# Patient Record
Sex: Female | Born: 1975
Health system: Southern US, Community
[De-identification: ages and names within clinical notes are randomized; demographics above are authoritative.]

## PROBLEM LIST (undated history)

## (undated) DIAGNOSIS — M2140 Flat foot [pes planus] (acquired), unspecified foot: Secondary | ICD-10-CM

## (undated) DIAGNOSIS — K635 Polyp of colon: Secondary | ICD-10-CM

## (undated) DIAGNOSIS — N921 Excessive and frequent menstruation with irregular cycle: Secondary | ICD-10-CM

## (undated) DIAGNOSIS — K589 Irritable bowel syndrome without diarrhea: Secondary | ICD-10-CM

## (undated) DIAGNOSIS — Z9889 Other specified postprocedural states: Secondary | ICD-10-CM

## (undated) DIAGNOSIS — K909 Intestinal malabsorption, unspecified: Secondary | ICD-10-CM

## (undated) DIAGNOSIS — E559 Vitamin D deficiency, unspecified: Secondary | ICD-10-CM

## (undated) DIAGNOSIS — Z8719 Personal history of other diseases of the digestive system: Secondary | ICD-10-CM

## (undated) DIAGNOSIS — R112 Nausea with vomiting, unspecified: Secondary | ICD-10-CM

## (undated) DIAGNOSIS — H8309 Labyrinthitis, unspecified ear: Secondary | ICD-10-CM

## (undated) DIAGNOSIS — R231 Pallor: Secondary | ICD-10-CM

## (undated) DIAGNOSIS — D509 Iron deficiency anemia, unspecified: Secondary | ICD-10-CM

## (undated) DIAGNOSIS — R1013 Epigastric pain: Secondary | ICD-10-CM

## (undated) DIAGNOSIS — R0602 Shortness of breath: Secondary | ICD-10-CM

## (undated) DIAGNOSIS — M1990 Unspecified osteoarthritis, unspecified site: Secondary | ICD-10-CM

## (undated) DIAGNOSIS — G5600 Carpal tunnel syndrome, unspecified upper limb: Secondary | ICD-10-CM

## (undated) DIAGNOSIS — E739 Lactose intolerance, unspecified: Secondary | ICD-10-CM

## (undated) DIAGNOSIS — E049 Nontoxic goiter, unspecified: Secondary | ICD-10-CM

## (undated) DIAGNOSIS — E669 Obesity, unspecified: Secondary | ICD-10-CM

## (undated) DIAGNOSIS — H40059 Ocular hypertension, unspecified eye: Secondary | ICD-10-CM

## (undated) DIAGNOSIS — G43909 Migraine, unspecified, not intractable, without status migrainosus: Secondary | ICD-10-CM

## (undated) DIAGNOSIS — K219 Gastro-esophageal reflux disease without esophagitis: Secondary | ICD-10-CM

## (undated) DIAGNOSIS — M2550 Pain in unspecified joint: Secondary | ICD-10-CM

## (undated) DIAGNOSIS — F419 Anxiety disorder, unspecified: Secondary | ICD-10-CM

## (undated) DIAGNOSIS — M25561 Pain in right knee: Secondary | ICD-10-CM

## (undated) DIAGNOSIS — M25562 Pain in left knee: Secondary | ICD-10-CM

## (undated) HISTORY — DX: Lactose intolerance, unspecified: E73.9

## (undated) HISTORY — DX: Iron deficiency anemia, unspecified: D50.9

## (undated) HISTORY — DX: Migraine, unspecified, not intractable, without status migrainosus: G43.909

## (undated) HISTORY — DX: Pain in left knee: M25.561

## (undated) HISTORY — DX: Other specified postprocedural states: Z98.890

## (undated) HISTORY — DX: Epigastric pain: R10.13

## (undated) HISTORY — DX: Pain in right knee: M25.562

## (undated) HISTORY — DX: Nontoxic goiter, unspecified: E04.9

## (undated) HISTORY — DX: Labyrinthitis, unspecified ear: H83.09

## (undated) HISTORY — DX: Gastro-esophageal reflux disease without esophagitis: K21.9

## (undated) HISTORY — DX: Shortness of breath: R06.02

## (undated) HISTORY — DX: Polyp of colon: K63.5

## (undated) HISTORY — DX: Personal history of other diseases of the digestive system: Z87.19

## (undated) HISTORY — DX: Pain in unspecified joint: M25.50

## (undated) HISTORY — DX: Irritable bowel syndrome, unspecified: K58.9

## (undated) HISTORY — PX: LAPAROSCOPY: SHX197

## (undated) HISTORY — DX: Flat foot (pes planus) (acquired), unspecified foot: M21.40

## (undated) HISTORY — DX: Vitamin D deficiency, unspecified: E55.9

## (undated) HISTORY — DX: Pallor: R23.1

## (undated) HISTORY — PX: UPPER GI ENDOSCOPY: SHX6162

## (undated) HISTORY — DX: Intestinal malabsorption, unspecified: K90.9

## (undated) HISTORY — DX: Obesity, unspecified: E66.9

## (undated) HISTORY — PX: COLONOSCOPY: SHX174

## (undated) HISTORY — DX: Excessive and frequent menstruation with irregular cycle: N92.1

## (undated) HISTORY — DX: Carpal tunnel syndrome, unspecified upper limb: G56.00

---

## 2003-12-11 ENCOUNTER — Emergency Department (HOSPITAL_COMMUNITY): Admission: EM | Admit: 2003-12-11 | Discharge: 2003-12-11 | Payer: Self-pay | Admitting: Emergency Medicine

## 2004-01-27 ENCOUNTER — Emergency Department (HOSPITAL_COMMUNITY): Admission: EM | Admit: 2004-01-27 | Discharge: 2004-01-27 | Payer: Self-pay | Admitting: Emergency Medicine

## 2009-08-12 ENCOUNTER — Emergency Department (HOSPITAL_COMMUNITY): Admission: EM | Admit: 2009-08-12 | Discharge: 2009-08-13 | Payer: Self-pay | Admitting: Emergency Medicine

## 2009-09-11 ENCOUNTER — Other Ambulatory Visit: Admission: RE | Admit: 2009-09-11 | Discharge: 2009-09-11 | Payer: Self-pay | Admitting: Obstetrics and Gynecology

## 2010-03-21 ENCOUNTER — Ambulatory Visit (HOSPITAL_COMMUNITY): Admission: RE | Admit: 2010-03-21 | Discharge: 2010-03-21 | Payer: Self-pay | Admitting: Obstetrics and Gynecology

## 2010-03-27 ENCOUNTER — Ambulatory Visit (HOSPITAL_COMMUNITY): Admission: RE | Admit: 2010-03-27 | Discharge: 2010-03-27 | Payer: Self-pay | Admitting: Obstetrics and Gynecology

## 2010-03-28 ENCOUNTER — Inpatient Hospital Stay (HOSPITAL_COMMUNITY): Admission: AD | Admit: 2010-03-28 | Discharge: 2010-03-31 | Payer: Self-pay | Admitting: Obstetrics and Gynecology

## 2010-03-29 ENCOUNTER — Encounter (INDEPENDENT_AMBULATORY_CARE_PROVIDER_SITE_OTHER): Payer: Self-pay | Admitting: Obstetrics and Gynecology

## 2010-12-28 ENCOUNTER — Encounter: Payer: Self-pay | Admitting: Internal Medicine

## 2011-02-24 LAB — CBC
HCT: 23.9 % — ABNORMAL LOW (ref 36.0–46.0)
HCT: 25 % — ABNORMAL LOW (ref 36.0–46.0)
HCT: 30.7 % — ABNORMAL LOW (ref 36.0–46.0)
Hemoglobin: 7.6 g/dL — ABNORMAL LOW (ref 12.0–15.0)
Hemoglobin: 7.9 g/dL — ABNORMAL LOW (ref 12.0–15.0)
Hemoglobin: 9.6 g/dL — ABNORMAL LOW (ref 12.0–15.0)
MCHC: 31.1 g/dL (ref 30.0–36.0)
MCHC: 31.4 g/dL (ref 30.0–36.0)
MCHC: 31.8 g/dL (ref 30.0–36.0)
MCV: 67.4 fL — ABNORMAL LOW (ref 78.0–100.0)
MCV: 67.5 fL — ABNORMAL LOW (ref 78.0–100.0)
MCV: 68 fL — ABNORMAL LOW (ref 78.0–100.0)
Platelets: 130 10*3/uL — ABNORMAL LOW (ref 150–400)
Platelets: 147 10*3/uL — ABNORMAL LOW (ref 150–400)
Platelets: 164 10*3/uL (ref 150–400)
RBC: 3.55 MIL/uL — ABNORMAL LOW (ref 3.87–5.11)
RBC: 3.68 MIL/uL — ABNORMAL LOW (ref 3.87–5.11)
RBC: 4.55 MIL/uL (ref 3.87–5.11)
RDW: 21.9 % — ABNORMAL HIGH (ref 11.5–15.5)
RDW: 22.1 % — ABNORMAL HIGH (ref 11.5–15.5)
RDW: 22.1 % — ABNORMAL HIGH (ref 11.5–15.5)
WBC: 14.8 10*3/uL — ABNORMAL HIGH (ref 4.0–10.5)
WBC: 15.5 10*3/uL — ABNORMAL HIGH (ref 4.0–10.5)
WBC: 18.7 10*3/uL — ABNORMAL HIGH (ref 4.0–10.5)

## 2011-02-24 LAB — TYPE AND SCREEN
ABO/RH(D): B POS
Antibody Screen: NEGATIVE

## 2011-02-24 LAB — RPR: RPR Ser Ql: NONREACTIVE

## 2011-02-24 LAB — ABO/RH: ABO/RH(D): B POS

## 2011-03-13 LAB — CBC
HCT: 35.7 % — ABNORMAL LOW (ref 36.0–46.0)
Hemoglobin: 11.7 g/dL — ABNORMAL LOW (ref 12.0–15.0)
MCHC: 32.9 g/dL (ref 30.0–36.0)
MCV: 87.3 fL (ref 78.0–100.0)
Platelets: 232 10*3/uL (ref 150–400)
RBC: 4.09 MIL/uL (ref 3.87–5.11)
RDW: 14.6 % (ref 11.5–15.5)
WBC: 8.7 10*3/uL (ref 4.0–10.5)

## 2011-03-13 LAB — DIFFERENTIAL
Basophils Absolute: 0 10*3/uL (ref 0.0–0.1)
Basophils Relative: 0 % (ref 0–1)
Eosinophils Absolute: 0.1 10*3/uL (ref 0.0–0.7)
Eosinophils Relative: 1 % (ref 0–5)
Lymphocytes Relative: 15 % (ref 12–46)
Lymphs Abs: 1.3 10*3/uL (ref 0.7–4.0)
Monocytes Absolute: 0.6 10*3/uL (ref 0.1–1.0)
Monocytes Relative: 7 % (ref 3–12)
Neutro Abs: 6.6 10*3/uL (ref 1.7–7.7)
Neutrophils Relative %: 76 % (ref 43–77)

## 2011-03-13 LAB — POCT I-STAT, CHEM 8
BUN: 13 mg/dL (ref 6–23)
Calcium, Ion: 1.13 mmol/L (ref 1.12–1.32)
Chloride: 105 mEq/L (ref 96–112)
Creatinine, Ser: 1 mg/dL (ref 0.4–1.2)
Glucose, Bld: 93 mg/dL (ref 70–99)
HCT: 37 % (ref 36.0–46.0)
Hemoglobin: 12.6 g/dL (ref 12.0–15.0)
Potassium: 3.9 mEq/L (ref 3.5–5.1)
Sodium: 138 mEq/L (ref 135–145)
TCO2: 21 mmol/L (ref 0–100)

## 2011-03-13 LAB — HCG, QUANTITATIVE, PREGNANCY: hCG, Beta Chain, Quant, S: 73136 m[IU]/mL — ABNORMAL HIGH (ref <5)

## 2011-03-13 LAB — URINALYSIS, ROUTINE W REFLEX MICROSCOPIC
Bilirubin Urine: NEGATIVE
Glucose, UA: NEGATIVE mg/dL
Hgb urine dipstick: NEGATIVE
Ketones, ur: NEGATIVE mg/dL
Nitrite: NEGATIVE
Protein, ur: NEGATIVE mg/dL
Specific Gravity, Urine: 1.024 (ref 1.005–1.030)
Urobilinogen, UA: 1 mg/dL (ref 0.0–1.0)
pH: 6 (ref 5.0–8.0)

## 2011-03-13 LAB — POCT PREGNANCY, URINE: Preg Test, Ur: POSITIVE

## 2011-04-10 ENCOUNTER — Other Ambulatory Visit: Payer: Self-pay | Admitting: Obstetrics and Gynecology

## 2011-04-10 ENCOUNTER — Other Ambulatory Visit (HOSPITAL_COMMUNITY)
Admission: RE | Admit: 2011-04-10 | Discharge: 2011-04-10 | Disposition: A | Discharge status: Home / Self Care | Payer: Federal, State, Local not specified - PPO | Source: Ambulatory Visit | Attending: Obstetrics and Gynecology | Admitting: Obstetrics and Gynecology

## 2011-04-10 DIAGNOSIS — Z01419 Encounter for gynecological examination (general) (routine) without abnormal findings: Secondary | ICD-10-CM | POA: Insufficient documentation

## 2012-04-29 ENCOUNTER — Other Ambulatory Visit (HOSPITAL_COMMUNITY)
Admission: RE | Admit: 2012-04-29 | Discharge: 2012-04-29 | Disposition: A | Discharge status: Home / Self Care | Payer: Federal, State, Local not specified - PPO | Source: Ambulatory Visit | Attending: Obstetrics and Gynecology | Admitting: Obstetrics and Gynecology

## 2012-04-29 ENCOUNTER — Other Ambulatory Visit: Payer: Self-pay | Admitting: Obstetrics and Gynecology

## 2012-04-29 DIAGNOSIS — Z01419 Encounter for gynecological examination (general) (routine) without abnormal findings: Secondary | ICD-10-CM | POA: Insufficient documentation

## 2013-10-03 ENCOUNTER — Other Ambulatory Visit: Payer: Self-pay | Admitting: Obstetrics and Gynecology

## 2013-10-03 ENCOUNTER — Other Ambulatory Visit (HOSPITAL_COMMUNITY)
Admission: RE | Admit: 2013-10-03 | Discharge: 2013-10-03 | Disposition: A | Discharge status: Home / Self Care | Payer: Federal, State, Local not specified - PPO | Source: Ambulatory Visit | Attending: Obstetrics and Gynecology | Admitting: Obstetrics and Gynecology

## 2013-10-03 DIAGNOSIS — Z1151 Encounter for screening for human papillomavirus (HPV): Secondary | ICD-10-CM | POA: Insufficient documentation

## 2013-10-03 DIAGNOSIS — Z01419 Encounter for gynecological examination (general) (routine) without abnormal findings: Secondary | ICD-10-CM | POA: Insufficient documentation

## 2013-10-23 ENCOUNTER — Other Ambulatory Visit: Payer: Self-pay

## 2013-10-23 DIAGNOSIS — Z1231 Encounter for screening mammogram for malignant neoplasm of breast: Secondary | ICD-10-CM

## 2013-11-24 ENCOUNTER — Ambulatory Visit: Payer: Federal, State, Local not specified - PPO

## 2014-08-03 LAB — PULMONARY FUNCTION TEST

## 2014-10-10 ENCOUNTER — Other Ambulatory Visit: Payer: Self-pay | Admitting: Gastroenterology

## 2014-10-10 DIAGNOSIS — R1084 Generalized abdominal pain: Secondary | ICD-10-CM

## 2014-10-17 ENCOUNTER — Ambulatory Visit
Admission: RE | Admit: 2014-10-17 | Discharge: 2014-10-17 | Disposition: A | Discharge status: Home / Self Care | Payer: Federal, State, Local not specified - PPO | Source: Ambulatory Visit | Attending: Gastroenterology | Admitting: Gastroenterology

## 2014-10-17 DIAGNOSIS — R1084 Generalized abdominal pain: Secondary | ICD-10-CM

## 2014-10-17 MED ORDER — IOHEXOL 300 MG/ML  SOLN
100.0000 mL | Freq: Once | INTRAMUSCULAR | Status: AC | PRN
  Administered 2014-10-17: 100 mL via INTRAVENOUS

## 2015-01-22 ENCOUNTER — Other Ambulatory Visit: Payer: Self-pay | Admitting: Obstetrics and Gynecology

## 2015-01-22 ENCOUNTER — Other Ambulatory Visit (HOSPITAL_COMMUNITY)
Admission: RE | Admit: 2015-01-22 | Discharge: 2015-01-22 | Disposition: A | Discharge status: Home / Self Care | Payer: Federal, State, Local not specified - PPO | Source: Ambulatory Visit | Attending: Obstetrics and Gynecology | Admitting: Obstetrics and Gynecology

## 2015-01-22 DIAGNOSIS — Z01419 Encounter for gynecological examination (general) (routine) without abnormal findings: Secondary | ICD-10-CM | POA: Insufficient documentation

## 2015-01-23 LAB — CYTOLOGY - PAP

## 2015-02-19 DIAGNOSIS — K580 Irritable bowel syndrome with diarrhea: Secondary | ICD-10-CM | POA: Insufficient documentation

## 2015-02-19 DIAGNOSIS — B009 Herpesviral infection, unspecified: Secondary | ICD-10-CM | POA: Insufficient documentation

## 2015-02-19 DIAGNOSIS — K219 Gastro-esophageal reflux disease without esophagitis: Secondary | ICD-10-CM | POA: Insufficient documentation

## 2015-02-21 DIAGNOSIS — E042 Nontoxic multinodular goiter: Secondary | ICD-10-CM | POA: Insufficient documentation

## 2015-03-13 DIAGNOSIS — R231 Pallor: Secondary | ICD-10-CM | POA: Insufficient documentation

## 2015-03-19 DIAGNOSIS — R195 Other fecal abnormalities: Secondary | ICD-10-CM | POA: Insufficient documentation

## 2015-04-08 ENCOUNTER — Encounter: Payer: Self-pay | Admitting: Interventional Cardiology

## 2015-04-08 ENCOUNTER — Ambulatory Visit (INDEPENDENT_AMBULATORY_CARE_PROVIDER_SITE_OTHER): Payer: Federal, State, Local not specified - PPO | Admitting: Interventional Cardiology

## 2015-04-08 VITALS — BP 104/68 | HR 65 | Ht 63.0 in | Wt 192.1 lb

## 2015-04-08 DIAGNOSIS — I471 Supraventricular tachycardia: Secondary | ICD-10-CM

## 2015-04-08 DIAGNOSIS — I209 Angina pectoris, unspecified: Secondary | ICD-10-CM | POA: Diagnosis not present

## 2015-04-08 DIAGNOSIS — R079 Chest pain, unspecified: Secondary | ICD-10-CM | POA: Insufficient documentation

## 2015-04-08 DIAGNOSIS — E042 Nontoxic multinodular goiter: Secondary | ICD-10-CM

## 2015-04-08 DIAGNOSIS — D509 Iron deficiency anemia, unspecified: Secondary | ICD-10-CM | POA: Insufficient documentation

## 2015-04-08 DIAGNOSIS — K2100 Gastro-esophageal reflux disease with esophagitis, without bleeding: Secondary | ICD-10-CM

## 2015-04-08 DIAGNOSIS — K219 Gastro-esophageal reflux disease without esophagitis: Secondary | ICD-10-CM | POA: Insufficient documentation

## 2015-04-08 DIAGNOSIS — E049 Nontoxic goiter, unspecified: Secondary | ICD-10-CM | POA: Insufficient documentation

## 2015-04-08 NOTE — Patient Instructions (Signed)
Medication Instructions:  Your physician recommends that you continue on your current medications as directed. Please refer to the Current Medication list given to you today.   Labwork: None   Testing/Procedures: Your physician has requested that you have an echocardiogram. Echocardiography is a painless test that uses sound waves to create images of your heart. It provides your doctor with information about the size and shape of your heart and how well your heart's chambers and valves are working. This procedure takes approximately one hour. There are no restrictions for this procedure.  Your physician has recommended that you wear an event monitor. Event monitors are medical devices that record the heart's electrical activity. Doctors most often [place] these monitors to diagnose arrhythmias. Arrhythmias are problems with the speed or rhythm of the heartbeat. The monitor is a small, portable device. You can wear one while you do your normal daily activities. This is usually used to diagnose what is causing palpitations/syncope (passing out).    Follow-Up: Your physician recommends that you schedule a follow-up appointment pending results   Any Other Special Instructions Will Be Listed Below (If Applicable).

## 2015-04-08 NOTE — Progress Notes (Signed)
Cardiology Office Note   Date:  04/08/2015   ID:  Nicole Booth, DOB 1976/10/22, MRN [id number]  PCP:  Lilian Coma., MD  Cardiologist:   Sinclair Grooms, MD   Chief Complaint  Patient presents with  . Chest Pain      History of Present Illness: Nicole Booth is a 39 y.o. female who presents for evaluation of recurring palpitations, chest pressure, with discomfort radiating into her jaw bilaterally. She has a history of anemia. The anemia is iron deficiency. She was initially diagnosed one year ago. She was treated and now it has recurred. Hemoglobin is been as low as 8.7. She has no exertional discomfort. She denies palpitations and syncope. There is no orthopnea. She has not had lower extremity edema.  She denies back pain, prior stroke, smoking, diabetes, hypertension, prior stroke, heart murmurs child, and family history of sudden death.    Past Medical History  Diagnosis Date  . Obesity   . Livedo reticularis   . Goiter   . GERD (gastroesophageal reflux disease)   . Epigastric pain   . Iron deficiency anemia   . Glaucoma   . Vitamin D deficiency   . Migraine headache   . Labyrinthitis   . IBS (irritable bowel syndrome)   . Carpal tunnel syndrome   . Bilateral knee pain   . Joint pain   . Pes planus   . SOB (shortness of breath)   . Mesothelioma   . Hyperplastic colon polyp   . Lactose intolerance   . H/O umbilical hernia repair     Past Surgical History  Procedure Laterality Date  . Laparoscopy    . Cesarean section       Current Outpatient Prescriptions  Medication Sig Dispense Refill  . chlorpheniramine-HYDROcodone (TUSSIONEX) 10-8 MG/5ML LQCR 5 mLs at bedtime as needed.   0  . hyoscyamine (ANASPAZ) 0.125 MG TBDP disintergrating tablet Place 0.125 mg under the tongue every 4 (four) hours as needed.   6  . hyoscyamine (LEVBID) 0.375 MG 12 hr tablet Take 0.375 mg by mouth at bedtime.  3  . NU-IRON 150 MG capsule Take 150 mg by mouth  daily.   11  . pantoprazole (PROTONIX) 40 MG tablet Take 40 mg by mouth daily.  12  . QSYMIA 7.5-46 MG CP24      No current facility-administered medications for this visit.    Allergies:   Penicillins; Dairy aid; and Shellfish-derived products    Social History:  The patient  reports that she has never smoked. She does not have any smokeless tobacco history on file.   Family History:  The patient's family history includes Breast cancer in her paternal aunt; Cancer in her maternal aunt; Diabetes in her father; Fibromyalgia in her mother; Hypertension in her father.    ROS:  Please see the history of present illness.   Otherwise, review of systems are positive for difficulty sleeping, snoring, fatigue, and weight gain. She has a history of a goiter.   All other systems are reviewed and negative.    PHYSICAL EXAM: VS:  BP 104/68 mmHg  Pulse 65  Ht 5\' 3"  (1.6 m)  Wt 192 lb 1.9 oz (87.145 kg)  BMI 34.04 kg/m2  LMP 04/02/2015 , BMI Body mass index is 34.04 kg/(m^2). GEN: Well nourished, well developed, no acute distress HEENT: normal Neck: no JVD, carotid bruits, or masses Cardiac: RRR; no murmurs, rubs, or gallops,no edema  Respiratory:  clear to auscultation bilaterally, normal work  of breathing GI: soft, nontender, nondistended, + BS MS: no deformity or atrophy Skin: warm and dry, no rash Neuro:  Strength and sensation are intact Psych: euthymic mood, full affect   EKG:  EKG is ordered today. The ekg ordered today demonstrates normal sinus rhythm with nonspecific T-wave flattening.   Recent Labs: No results found for requested labs within last 365 days.    Lipid Panel No results found for: CHOL, TRIG, HDL, CHOLHDL, VLDL, LDLCALC, LDLDIRECT    Wt Readings from Last 3 Encounters:  04/08/15 192 lb 1.9 oz (87.145 kg)      Other studies Reviewed: Additional studies/ records that were reviewed today include: [facility], [facility], and Dr. Earlean Shawl.. Review of the above records  demonstrates: These records indicated the patient is had a history of iron deficiency anemia that has been recurrent since 2008. Most recently the hemoglobin is 8.9. Prior episodes of anemia had been taking hemoglobin levels around 10.5. She has a history of multinodular goiter, livedo reticularis, esophageal reflux, and glaucoma   ASSESSMENT AND PLAN:  Chest tightness: This is likely due to ischemia produced in the setting of severe anemia by a superimposed supraventricular arrhythmia  Recurrent tachycardia/palpitations: Rule out PSVT of the patient's age   Iron deficiency anemia: Etiologies being defined by Dr. Earlean Shawl  Multinodular goiter: No history of hyper or hypothyroidism  Esophageal reflux: Could potentially be the source of the patient's discomfort but would not explain the palpitations.  Current medicines are reviewed at length with the patient today.  The patient does not have concerns regarding medicines.  The following changes have been made:  The patient will undergo a 2-D Doppler echocardiogram to assess LV size, function, and to rule out pulmonary hypertension. She will also begin a 30 day continuous monitoring to identify the source of the tachycardia.  Labs/ tests ordered today include:   Orders Placed This Encounter  Procedures  . Cardiac event monitor  . EKG 12-Lead  . Echocardiogram     Disposition:   FU with HS in 5 weeks  Signed, Sinclair Grooms, MD  04/08/2015 5:34 PM    [address], [place], [place]  [address] Phone: [phone]; Fax: [phone]

## 2015-04-09 ENCOUNTER — Encounter: Payer: Self-pay | Admitting: Radiology

## 2015-04-09 ENCOUNTER — Ambulatory Visit (INDEPENDENT_AMBULATORY_CARE_PROVIDER_SITE_OTHER): Payer: Federal, State, Local not specified - PPO

## 2015-04-09 DIAGNOSIS — I471 Supraventricular tachycardia: Secondary | ICD-10-CM | POA: Diagnosis not present

## 2015-04-09 NOTE — Progress Notes (Signed)
Patient ID: Draya Felker, female   DOB: 06-13-1976, 39 y.o.   MRN: [id number] lifewatch 30 day monitor applied. EOS 05-10-15

## 2015-04-15 ENCOUNTER — Ambulatory Visit (HOSPITAL_COMMUNITY): Payer: Federal, State, Local not specified - PPO | Attending: Cardiovascular Disease

## 2015-04-15 DIAGNOSIS — I209 Angina pectoris, unspecified: Secondary | ICD-10-CM | POA: Diagnosis not present

## 2015-04-15 DIAGNOSIS — I340 Nonrheumatic mitral (valve) insufficiency: Secondary | ICD-10-CM | POA: Insufficient documentation

## 2015-04-16 ENCOUNTER — Telehealth: Payer: Self-pay | Admitting: Interventional Cardiology

## 2015-04-16 NOTE — Telephone Encounter (Signed)
Pt aware of echo results. The echocardiogram demonstrates a structurally normal heart. Pt is currenltly wearing an event monitor. Adv her we call with the results when that is completed. Pt verbalized understanding.

## 2015-04-16 NOTE — Telephone Encounter (Signed)
New message ° ° ° ° ° °Want echo results °

## 2015-04-16 NOTE — Telephone Encounter (Signed)
-----   Message from Belva Crome, MD sent at 04/15/2015  7:38 PM EDT ----- The echocardiogram demonstrates a structurally normal heart

## 2015-04-16 NOTE — Telephone Encounter (Signed)
Follow up ° ° ° ° ° °Returning Lisa's call °

## 2015-04-16 NOTE — Telephone Encounter (Signed)
Returned pt call.[email or web address] 

## 2015-04-25 ENCOUNTER — Telehealth: Payer: Self-pay | Admitting: *Deleted

## 2015-04-25 NOTE — Telephone Encounter (Signed)
Called patient with new patient appointment for 05/22/2015 at [place] with financial, then lab and to see our Nurse Practitioner.  Patient confused as to why she is seeing Dr. Marin Olp because she was told appointment was to be with Dr. Alvy Bimler.  Told patient I would research this.  Called Tiffany at [facility].  Dr. Alvy Bimler schedule was full so was sent to [place] since patient has [place] address.  Called patient back and she was fine with this.  Told patient she would be seeing a nurse practitioner but Dr. Marin Olp would also be coming in to oversee her care.  Patient not comfortable with this as she said she wants to only see a Dr. And has been misdiagnosed in past by practitioners.  Told patient Dr. Marin Olp schedule is very full and would call Tiffany to see if anyone at [facility] could see her sooner.  cAlled Tiffany and she said she would call patient and try to get her in with a provider sooner.

## 2015-04-29 ENCOUNTER — Telehealth: Payer: Self-pay | Admitting: Hematology & Oncology

## 2015-04-29 ENCOUNTER — Telehealth: Payer: Self-pay | Admitting: Interventional Cardiology

## 2015-04-29 NOTE — Telephone Encounter (Signed)
°  1. Is this related to a heart monitor you are wearing?  Yes  2. What is your issue?? Pt calling stating she has been wearing heart monitor for about 20 days and she is supposed to wear it for 30 days and she wants to know if we have enough information so that she can stop wearing it b/c she is having severe skin irritations from it. Pt also would like to know if she were to take it off before the 30 days if it would cause any issues with her insurance paying for the monitor. Please call back and advise.

## 2015-04-29 NOTE — Telephone Encounter (Signed)
Contacted Tiffany regarding scheduling appt for pt at the [facility]. Tiffany will contact pt.

## 2015-04-29 NOTE — Telephone Encounter (Signed)
Returned pt call. Adv her It would be ok to d/c monitor. Adv her to mail it back to the company using the box and return label given. Adv pt the sooner she returns the monitor the sooner we can get the report. Once her monitor report is reviewed by Dr.Smith, I will call back with the results. She verbalized understanding.

## 2015-04-30 ENCOUNTER — Telehealth: Payer: Self-pay | Admitting: Hematology & Oncology

## 2015-04-30 NOTE — Telephone Encounter (Signed)
Patient called complaining about New patient apt that was scheduled.  She refuse to see Np and demands to Md.  Patient stated she would be will to sch at a later date to see Md.  05/22/15 apt was cx and resch with Md on 05/28/15

## 2015-05-13 ENCOUNTER — Ambulatory Visit: Payer: Federal, State, Local not specified - PPO | Admitting: Internal Medicine

## 2015-05-22 ENCOUNTER — Ambulatory Visit: Payer: Federal, State, Local not specified - PPO

## 2015-05-22 ENCOUNTER — Other Ambulatory Visit: Payer: Federal, State, Local not specified - PPO

## 2015-05-22 ENCOUNTER — Ambulatory Visit: Payer: Federal, State, Local not specified - PPO | Admitting: Family

## 2015-05-27 ENCOUNTER — Telehealth: Payer: Self-pay | Admitting: Hematology & Oncology

## 2015-05-27 ENCOUNTER — Telehealth: Payer: Self-pay

## 2015-05-27 NOTE — Telephone Encounter (Signed)
Pt aware of cardiac monitor results -Mobitz 1 -No evidence of serious AVB -Normal study (also normal echo) -No further w/u needed Pt verbalized understanding.

## 2015-05-27 NOTE — Telephone Encounter (Signed)
I spoke w NEW PATIENT today to remind them of their appointment with Dr. Ennever. Also, advised them to bring all medication bottles and insurance card information. ° °

## 2015-05-28 ENCOUNTER — Ambulatory Visit (HOSPITAL_BASED_OUTPATIENT_CLINIC_OR_DEPARTMENT_OTHER): Payer: Federal, State, Local not specified - PPO | Admitting: Family

## 2015-05-28 ENCOUNTER — Other Ambulatory Visit (HOSPITAL_BASED_OUTPATIENT_CLINIC_OR_DEPARTMENT_OTHER): Payer: Federal, State, Local not specified - PPO

## 2015-05-28 ENCOUNTER — Ambulatory Visit (HOSPITAL_BASED_OUTPATIENT_CLINIC_OR_DEPARTMENT_OTHER): Payer: Federal, State, Local not specified - PPO

## 2015-05-28 ENCOUNTER — Encounter: Payer: Self-pay | Admitting: Family

## 2015-05-28 ENCOUNTER — Ambulatory Visit: Payer: Federal, State, Local not specified - PPO

## 2015-05-28 VITALS — BP 122/84 | HR 79 | Temp 98.0°F | Wt 186.0 lb

## 2015-05-28 DIAGNOSIS — K909 Intestinal malabsorption, unspecified: Secondary | ICD-10-CM

## 2015-05-28 DIAGNOSIS — N921 Excessive and frequent menstruation with irregular cycle: Secondary | ICD-10-CM

## 2015-05-28 DIAGNOSIS — D509 Iron deficiency anemia, unspecified: Secondary | ICD-10-CM

## 2015-05-28 DIAGNOSIS — Z8489 Family history of other specified conditions: Secondary | ICD-10-CM

## 2015-05-28 LAB — IRON AND TIBC CHCC
%SAT: 4 % — ABNORMAL LOW (ref 21–57)
Iron: 16 ug/dL — ABNORMAL LOW (ref 41–142)
TIBC: 443 ug/dL (ref 236–444)
UIBC: 426 ug/dL — ABNORMAL HIGH (ref 120–384)

## 2015-05-28 LAB — CBC WITH DIFFERENTIAL (CANCER CENTER ONLY)
BASO#: 0.1 10*3/uL (ref 0.0–0.2)
BASO%: 1.1 % (ref 0.0–2.0)
EOS%: 2.2 % (ref 0.0–7.0)
Eosinophils Absolute: 0.1 10*3/uL (ref 0.0–0.5)
HCT: 30.9 % — ABNORMAL LOW (ref 34.8–46.6)
HGB: 9.2 g/dL — ABNORMAL LOW (ref 11.6–15.9)
LYMPH#: 1.3 10*3/uL (ref 0.9–3.3)
LYMPH%: 29 % (ref 14.0–48.0)
MCH: 20.4 pg — ABNORMAL LOW (ref 26.0–34.0)
MCHC: 29.8 g/dL — ABNORMAL LOW (ref 32.0–36.0)
MCV: 69 fL — ABNORMAL LOW (ref 81–101)
MONO#: 0.4 10*3/uL (ref 0.1–0.9)
MONO%: 8.2 % (ref 0.0–13.0)
NEUT#: 2.7 10*3/uL (ref 1.5–6.5)
NEUT%: 59.5 % (ref 39.6–80.0)
Platelets: 307 10*3/uL (ref 145–400)
RBC: 4.51 10*6/uL (ref 3.70–5.32)
RDW: 17.6 % — ABNORMAL HIGH (ref 11.1–15.7)
WBC: 4.5 10*3/uL (ref 3.9–10.0)

## 2015-05-28 LAB — FERRITIN CHCC: Ferritin: 6 ng/ml — ABNORMAL LOW (ref 9–269)

## 2015-05-28 LAB — CHCC SATELLITE - SMEAR

## 2015-05-28 MED ORDER — SODIUM CHLORIDE 0.9 % IV SOLN
510.0000 mg | Freq: Once | INTRAVENOUS | Status: AC
  Administered 2015-05-28: 510 mg via INTRAVENOUS
  Filled 2015-05-28: qty 17

## 2015-05-28 MED ORDER — DIPHENHYDRAMINE HCL 25 MG PO CAPS
25.0000 mg | ORAL_CAPSULE | Freq: Once | ORAL | Status: AC
  Administered 2015-05-28: 25 mg via ORAL

## 2015-05-28 MED ORDER — DIPHENHYDRAMINE HCL 25 MG PO CAPS
ORAL_CAPSULE | ORAL | Status: AC
  Filled 2015-05-28: qty 1

## 2015-05-28 MED ORDER — FAMOTIDINE IN NACL 20-0.9 MG/50ML-% IV SOLN
20.0000 mg | Freq: Once | INTRAVENOUS | Status: AC
  Administered 2015-05-28: 20 mg via INTRAVENOUS

## 2015-05-28 MED ORDER — FAMOTIDINE IN NACL 20-0.9 MG/50ML-% IV SOLN
INTRAVENOUS | Status: AC
  Filled 2015-05-28: qty 50

## 2015-05-28 MED ORDER — FOLIC ACID 1 MG PO TABS: 1.0000 mg | ORAL_TABLET | Freq: Every day | ORAL | Status: DC

## 2015-05-28 NOTE — Patient Instructions (Signed)

## 2015-05-28 NOTE — Progress Notes (Signed)
Hematology/Oncology Consultation   Name: Nicole Booth      MRN: [id number]    Location: Room/bed info not found  Date: 05/28/2015 Time:4:46 PM   REFERRING PHYSICIAN: Richmond Campbell, MD  REASON FOR CONSULT: Iron deficiency anemia    DIAGNOSIS:  Iron deficiency anemia  HISTORY OF PRESENT ILLNESS: Ms. Nicole Booth is a very pleasant African American female with history of iron deficiency. She has tried taking oral iron but it causes her to itch and still has a ferritin of 4. She takes Zantac daily for GERD so this would prevent her absorption or PO iron.  She is feeling very fatigued and has been experiencing palpitations recently. She also chews lots of ice. She has SOB on exertion and when talking for an extended period of time. She has had chills and numbness/tingling in her hands and feet at times.  No fever, n/v, cough, rash, dizziness, chest pain, constipation, blood in urine.  She states that she has had a problem with abdominal pain and diarrhea for 15 years or more. She recently started seeing Dr. Earlean Shawl. Her hemoccult test was positive. She has had an edoscopy and colonoscopy with biopsies taken recently. We will contact Dr. Liliane Channel office and get these pathology reports. She has not yet gone over these reports with him and does not know the results.  Her father has the sickle cell trait. She is unsure if she has been tested. We did get a hemoglobinopathy evaluation today. She does not have a close relationship with most of her family and is unsure about a lot of their health history. No cancer history that she knows of and no actual sickle cell disease. She is not on folic acid. She does have a goiter on her thyroid and has an appointment with endocrinology in August.  Her cycles are "normal and not heavy." She has noticed lots of clots.  No personal of familial history of blood clots or bleeding disorders.  She does not smoke or drink alcohol.  She has 2 sons both of whom are healthy.  No miscarriages. She did have preeclampsia and a C-section with her second baby.  No stomach surgeries.  No swelling or tenderness in her extremities. No new aches or pains.  Her appetite is good and she is staying hydrated. No significant weight loss or gain.  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 We are thankful for her service.   ROS: All other 10 point review of systems is negative.   PAST MEDICAL HISTORY:   Past Medical History  Diagnosis Date  . Obesity   . Livedo reticularis   . Goiter   . GERD (gastroesophageal reflux disease)   . Epigastric pain   . Iron deficiency anemia   . Glaucoma   . Vitamin D deficiency   . Migraine headache   . Labyrinthitis   . IBS (irritable bowel syndrome)   . Carpal tunnel syndrome   . Bilateral knee pain   . Joint pain   . Pes planus   . SOB (shortness of breath)   . Mesothelioma   . Hyperplastic colon polyp   . Lactose intolerance   . H/O umbilical hernia repair     ALLERGIES: Allergies  Allergen Reactions  . Penicillins Hives and Rash  . Dairy Aid [Lactase] Other (See Comments)    unknown  . Shellfish-Derived Products Itching      MEDICATIONS:  Current Outpatient Prescriptions on File Prior to Visit  Medication Sig Dispense Refill  . hyoscyamine (ANASPAZ) 0.125 MG TBDP disintergrating tablet Place 0.125 mg under the tongue every 4 (four) hours as needed.   6  . hyoscyamine (LEVBID) 0.375 MG 12 hr tablet Take 0.375 mg by mouth at bedtime.  3  . NU-IRON 150 MG capsule Take 150 mg by mouth daily.   11  . QSYMIA 7.5-46 MG CP24      No current facility-administered medications on file prior to visit.     PAST SURGICAL HISTORY Past Surgical History  Procedure Laterality Date  . Laparoscopy    . Cesarean section      FAMILY HISTORY: Family History  Problem Relation Age of Onset  . Fibromyalgia Mother   . Hypertension Father   . Diabetes Father   . Cancer Maternal Aunt   . Breast cancer  Paternal Aunt     SOCIAL HISTORY:  reports that she has never smoked. She does not have any smokeless tobacco history on file. Her alcohol and drug histories are not on file.  PERFORMANCE STATUS: The patient's performance status is 1 - Symptomatic but completely ambulatory  PHYSICAL EXAM: Most Recent Vital Signs: Blood pressure 122/84, pulse 79, temperature 98 F (36.7 C), temperature source Oral, weight 186 lb (84.369 kg). BP 122/84 mmHg  Pulse 79  Temp(Src) 98 F (36.7 C) (Oral)  Wt 186 lb (84.369 kg)  General Appearance:    Alert, cooperative, no distress, appears stated age  Head:    Normocephalic, without obvious abnormality, atraumatic  Eyes:    PERRL, conjunctiva/corneas clear, EOM's intact, fundi    benign, both eyes        Throat:   Lips, mucosa, and tongue normal; teeth and gums normal  Neck:   Supple, symmetrical, trachea midline, no adenopathy;    thyroid:  no enlargement/tenderness/nodules; no carotid   bruit or JVD  Back:     Symmetric, no curvature, ROM normal, no CVA tenderness  Lungs:     Clear to auscultation bilaterally, respirations unlabored  Chest Wall:    No tenderness or deformity   Heart:    Regular rate and rhythm, S1 and S2 normal, no murmur, rub   or gallop     Abdomen:     Soft, non-tender, bowel sounds active all four quadrants,    no masses, no organomegaly        Extremities:   Extremities normal, atraumatic, no cyanosis or edema  Pulses:   2+ and symmetric all extremities  Skin:   Skin color, texture, turgor normal, no rashes or lesions  Lymph nodes:   Cervical, supraclavicular, and axillary nodes normal  Neurologic:   CNII-XII intact, normal strength, sensation and reflexes    throughout   LABORATORY DATA:  Results for orders placed or performed in visit on 05/28/15 (from the past 48 hour(s))  CBC with Differential [facility])     Status: Abnormal   Collection Time: 05/28/15 10:36 AM  Result Value Ref Range   WBC 4.5 3.9 - 10.0  10e3/uL   RBC 4.51 3.70 - 5.32 10e6/uL   HGB 9.2 (L) 11.6 - 15.9 g/dL   HCT 30.9 (L) 34.8 - 46.6 %   MCV 69 (L) 81 - 101 fL   MCH 20.4 (L) 26.0 - 34.0 pg   MCHC 29.8 (L) 32.0 - 36.0 g/dL   RDW 17.6 (H) 11.1 - 15.7 %   Platelets 307 145 - 400 10e3/uL   NEUT# 2.7 1.5 - 6.5 10e3/uL   LYMPH#  1.3 0.9 - 3.3 10e3/uL   MONO# 0.4 0.1 - 0.9 10e3/uL   Eosinophils Absolute 0.1 0.0 - 0.5 10e3/uL   BASO# 0.1 0.0 - 0.2 10e3/uL   NEUT% 59.5 39.6 - 80.0 %   LYMPH% 29.0 14.0 - 48.0 %   MONO% 8.2 0.0 - 13.0 %   EOS% 2.2 0.0 - 7.0 %   BASO% 1.1 0.0 - 2.0 %  [facility] - Smear     Status: None   Collection Time: 05/28/15 10:36 AM  Result Value Ref Range   Smear Result Smear Available   Iron and TIBC     Status: Abnormal   Collection Time: 05/28/15 10:36 AM  Result Value Ref Range   Iron 16 (L) 41 - 142 ug/dL   TIBC 443 236 - 444 ug/dL   UIBC 426 (H) 120 - 384 ug/dL   %SAT 4 (L) 21 - 57 %  Ferritin     Status: Abnormal   Collection Time: 05/28/15 10:36 AM  Result Value Ref Range   Ferritin 6 (L) 9 - 269 ng/ml  Reticulocyte Count (SLN)     Status: None (Preliminary result)   Collection Time: 05/28/15 10:37 AM  Result Value Ref Range   Retic Ct Pct 1.0 0.4 - 2.3 %   RBC. 4.63 3.87 - 5.11 MIL/uL   ABS Retic 46.3 19.0 - 186.0 K/uL      RADIOGRAPHY: No results found.     PATHOLOGY: None  ASSESSMENT/PLAN: Ms. Nicole Booth is a very pleasant African American female with history of iron deficiency. She recently had a positive hemoccult test and underwent endoscopy and colonoscopy with biopsies. We will obtain these reports from Dr. Liliane Channel office.  She is symptomatic with her anemia at this time.  Her ferritin was 6 and iron saturation 4%. Hgb is 9.2 and MCV 69. We will give her a dose of Feraheme today and again in 8 days. She would like to be premedicated before her infusion so we will give her Benadryl 25 mg PO and Pepcid 20 mg IVPB.  I will also have her start taking folic acid 1 mg daily. We  will plan to see her back in 6 weeks for labs and follow-up.  All questions were answered. She knows to call the clinic with any problems, questions or concerns. We can certainly see her much sooner if necessary.  She was discussed with and also seen by Dr. Marin Olp and he is in agreement with the aforementioned.   [facility]     Addendum:   I saw and examined the patient with Sarah. I looked at her blood smear. I think she clearly has iron deficiency. She has microcytic red cells. She has hyperchromic red cells. I do not see any target cells. There were no inclusion bodies.  I think that she has both iron malabsorption. She probably has some degree of menometrorrhagia.  There is a history of sickle cell in the family. I would not think that she has sickle cell. Even if she has the trait, it would not account for her anemia.  Her iron studies show marked iron deficiency. Her ferritin is only 6 with an iron saturation of 4%.  She will need 2 doses of IV iron.  We will plan to get her back to see [place] in another 4-5 weeks. I suspect that her hemoglobin should be much higher as well as her MCV.  We spent about 30 minutes with her. She is very nice.  Laurey Arrow  E.

## 2015-05-29 ENCOUNTER — Telehealth: Payer: Self-pay | Admitting: *Deleted

## 2015-05-29 ENCOUNTER — Telehealth: Payer: Self-pay | Admitting: Hematology & Oncology

## 2015-05-29 NOTE — Telephone Encounter (Signed)
Contacted pt regarding future appt in June and Aug

## 2015-05-29 NOTE — Telephone Encounter (Signed)
Patient wanting to know if she can drink alkaline water during her iron infusions. Spoke with Dr Marin Olp who is okay with this. Patient aware.

## 2015-05-30 ENCOUNTER — Encounter: Payer: Self-pay | Admitting: Family

## 2015-05-30 DIAGNOSIS — N921 Excessive and frequent menstruation with irregular cycle: Secondary | ICD-10-CM | POA: Insufficient documentation

## 2015-05-30 DIAGNOSIS — K909 Intestinal malabsorption, unspecified: Secondary | ICD-10-CM

## 2015-05-30 HISTORY — DX: Excessive and frequent menstruation with irregular cycle: N92.1

## 2015-05-30 HISTORY — DX: Intestinal malabsorption, unspecified: K90.9

## 2015-05-30 LAB — RETICULOCYTES (CHCC)
ABS Retic: 46.3 10*3/uL (ref 19.0–186.0)
RBC.: 4.63 MIL/uL (ref 3.87–5.11)
Retic Ct Pct: 1 % (ref 0.4–2.3)

## 2015-05-30 LAB — HEMOGLOBINOPATHY EVALUATION
Hemoglobin Other: 0 %
Hgb A2 Quant: 2 % — ABNORMAL LOW (ref 2.2–3.2)
Hgb A: 98 % — ABNORMAL HIGH (ref 96.8–97.8)
Hgb F Quant: 0 % (ref 0.0–2.0)
Hgb S Quant: 0 %

## 2015-06-04 ENCOUNTER — Other Ambulatory Visit: Payer: Self-pay | Admitting: *Deleted

## 2015-06-04 DIAGNOSIS — K909 Intestinal malabsorption, unspecified: Secondary | ICD-10-CM

## 2015-06-05 ENCOUNTER — Other Ambulatory Visit: Payer: Federal, State, Local not specified - PPO

## 2015-06-05 ENCOUNTER — Ambulatory Visit: Payer: Federal, State, Local not specified - PPO

## 2015-06-05 ENCOUNTER — Telehealth: Payer: Self-pay | Admitting: Hematology & Oncology

## 2015-06-05 NOTE — Telephone Encounter (Signed)
Pt called in to reschedule due to a call from school regarding her child

## 2015-06-07 ENCOUNTER — Ambulatory Visit (HOSPITAL_BASED_OUTPATIENT_CLINIC_OR_DEPARTMENT_OTHER): Payer: Federal, State, Local not specified - PPO

## 2015-06-07 ENCOUNTER — Ambulatory Visit: Payer: Federal, State, Local not specified - PPO

## 2015-06-07 DIAGNOSIS — D509 Iron deficiency anemia, unspecified: Secondary | ICD-10-CM

## 2015-06-07 MED ORDER — FAMOTIDINE IN NACL 20-0.9 MG/50ML-% IV SOLN
INTRAVENOUS | Status: AC
  Filled 2015-06-07: qty 50

## 2015-06-07 MED ORDER — DIPHENHYDRAMINE HCL 25 MG PO CAPS
25.0000 mg | ORAL_CAPSULE | Freq: Once | ORAL | Status: AC
  Administered 2015-06-07: 25 mg via ORAL

## 2015-06-07 MED ORDER — SODIUM CHLORIDE 0.9 % IV SOLN
510.0000 mg | Freq: Once | INTRAVENOUS | Status: AC
  Administered 2015-06-07: 510 mg via INTRAVENOUS
  Filled 2015-06-07: qty 17

## 2015-06-07 MED ORDER — FAMOTIDINE IN NACL 20-0.9 MG/50ML-% IV SOLN
20.0000 mg | Freq: Once | INTRAVENOUS | Status: AC
  Administered 2015-06-07: 20 mg via INTRAVENOUS

## 2015-06-07 MED ORDER — DIPHENHYDRAMINE HCL 25 MG PO CAPS
ORAL_CAPSULE | ORAL | Status: AC
  Filled 2015-06-07: qty 1

## 2015-06-07 NOTE — Patient Instructions (Signed)

## 2015-06-27 ENCOUNTER — Other Ambulatory Visit: Payer: Self-pay

## 2015-06-27 DIAGNOSIS — Z1231 Encounter for screening mammogram for malignant neoplasm of breast: Secondary | ICD-10-CM

## 2015-07-01 ENCOUNTER — Other Ambulatory Visit: Payer: Self-pay

## 2015-07-01 DIAGNOSIS — R1084 Generalized abdominal pain: Secondary | ICD-10-CM

## 2015-07-01 DIAGNOSIS — R195 Other fecal abnormalities: Secondary | ICD-10-CM

## 2015-07-01 NOTE — Addendum Note (Signed)
Addended by: Odis Hollingshead on: 07/01/2015 03:36 PM   Modules accepted: Orders

## 2015-07-02 ENCOUNTER — Other Ambulatory Visit: Payer: Self-pay

## 2015-07-02 DIAGNOSIS — K921 Melena: Secondary | ICD-10-CM

## 2015-07-02 DIAGNOSIS — R1084 Generalized abdominal pain: Secondary | ICD-10-CM

## 2015-07-02 NOTE — Addendum Note (Signed)
Addended by: Odis Hollingshead on: 07/02/2015 04:25 PM   Modules accepted: Orders

## 2015-07-03 ENCOUNTER — Other Ambulatory Visit: Payer: Self-pay

## 2015-07-03 DIAGNOSIS — K921 Melena: Secondary | ICD-10-CM

## 2015-07-03 DIAGNOSIS — R1084 Generalized abdominal pain: Secondary | ICD-10-CM

## 2015-07-05 ENCOUNTER — Other Ambulatory Visit (HOSPITAL_BASED_OUTPATIENT_CLINIC_OR_DEPARTMENT_OTHER): Payer: Federal, State, Local not specified - PPO

## 2015-07-05 DIAGNOSIS — D509 Iron deficiency anemia, unspecified: Secondary | ICD-10-CM | POA: Diagnosis not present

## 2015-07-05 DIAGNOSIS — K909 Intestinal malabsorption, unspecified: Secondary | ICD-10-CM

## 2015-07-05 LAB — CBC WITH DIFFERENTIAL (CANCER CENTER ONLY)
BASO#: 0 10*3/uL (ref 0.0–0.2)
BASO%: 0.6 % (ref 0.0–2.0)
EOS%: 2.7 % (ref 0.0–7.0)
Eosinophils Absolute: 0.1 10*3/uL (ref 0.0–0.5)
HCT: 41.6 % (ref 34.8–46.6)
HGB: 13.8 g/dL (ref 11.6–15.9)
LYMPH#: 1.4 10*3/uL (ref 0.9–3.3)
LYMPH%: 27.6 % (ref 14.0–48.0)
MCH: 25.8 pg — ABNORMAL LOW (ref 26.0–34.0)
MCHC: 33.2 g/dL (ref 32.0–36.0)
MCV: 78 fL — ABNORMAL LOW (ref 81–101)
MONO#: 0.3 10*3/uL (ref 0.1–0.9)
MONO%: 6.5 % (ref 0.0–13.0)
NEUT#: 3.1 10*3/uL (ref 1.5–6.5)
NEUT%: 62.6 % (ref 39.6–80.0)
Platelets: 290 10*3/uL (ref 145–400)
RBC: 5.35 10*6/uL — ABNORMAL HIGH (ref 3.70–5.32)
WBC: 4.9 10*3/uL (ref 3.9–10.0)

## 2015-07-05 LAB — RETICULOCYTES (CHCC)
ABS Retic: 48.9 10*3/uL (ref 19.0–186.0)
RBC.: 5.43 MIL/uL — ABNORMAL HIGH (ref 3.87–5.11)
Retic Ct Pct: 0.9 % (ref 0.4–2.3)

## 2015-07-08 ENCOUNTER — Encounter: Payer: Self-pay | Admitting: Nurse Practitioner

## 2015-07-08 LAB — IRON AND TIBC CHCC
%SAT: 28 % (ref 21–57)
Iron: 79 ug/dL (ref 41–142)
TIBC: 285 ug/dL (ref 236–444)
UIBC: 206 ug/dL (ref 120–384)

## 2015-07-08 LAB — FERRITIN CHCC: Ferritin: 61 ng/ml (ref 9–269)

## 2015-07-15 ENCOUNTER — Ambulatory Visit (HOSPITAL_COMMUNITY)
Admission: RE | Admit: 2015-07-15 | Discharge: 2015-07-15 | Disposition: A | Discharge status: Home / Self Care | Payer: Federal, State, Local not specified - PPO | Source: Ambulatory Visit | Attending: General Surgery | Admitting: General Surgery

## 2015-07-15 DIAGNOSIS — K921 Melena: Secondary | ICD-10-CM | POA: Diagnosis not present

## 2015-07-15 DIAGNOSIS — R109 Unspecified abdominal pain: Secondary | ICD-10-CM | POA: Insufficient documentation

## 2015-07-15 MED ORDER — SODIUM PERTECHNETATE TC 99M INJECTION: 10.5000 | Freq: Once | INTRAVENOUS | Status: AC | PRN

## 2015-07-16 ENCOUNTER — Ambulatory Visit (HOSPITAL_BASED_OUTPATIENT_CLINIC_OR_DEPARTMENT_OTHER): Payer: Federal, State, Local not specified - PPO | Admitting: Family

## 2015-07-16 ENCOUNTER — Encounter: Payer: Self-pay | Admitting: Family

## 2015-07-16 ENCOUNTER — Other Ambulatory Visit: Payer: Federal, State, Local not specified - PPO

## 2015-07-16 VITALS — BP 116/77 | HR 62 | Temp 97.9°F | Resp 16 | Ht 63.0 in | Wt 181.0 lb

## 2015-07-16 DIAGNOSIS — D509 Iron deficiency anemia, unspecified: Secondary | ICD-10-CM | POA: Diagnosis not present

## 2015-07-16 DIAGNOSIS — K909 Intestinal malabsorption, unspecified: Secondary | ICD-10-CM

## 2015-07-16 NOTE — Progress Notes (Signed)
Hematology and Oncology Follow Up Visit  Nicole Booth [id number] 1976-09-29 39 y.o. 07/16/2015   Principle Diagnosis:  Iron deficiency Anemia  Current Therapy:   IV iron as indicated     Interim History:  Nicole Booth is here today for a follow-up. She is feeling better. Her energy has improved since receiving 2 doses of Feraheme in June.  She is no longer chewing ice or having SOB. She denies fever, chills, n/v, cough, rash, dizziness, chest pain, palpitations,  changes in bowel of bladder habits.  No changes with her cycles. She has had no episodes of bleeding or bruising.  She is still having problems with abdominal pain and has an appointment with the [facility] clinic in [place] in September for a second opinion. She has been seen by GI and surgery and states that so far her work-up has been negative. The diarrhea continues but she has not noticed any more blood in her stool.  She has a healthy appetite and is staying hydrated. Her weight is stable.  No swelling , tenderness, numbness or tingling in her extremities. No aches or pains.   Medications:    Medication List       This list is accurate as of: 07/16/15  2:12 PM.  Always use your most recent med list.               folic acid 1 MG tablet  Commonly known as:  FOLVITE  Take 1 tablet (1 mg total) by mouth daily.     hyoscyamine 0.125 MG Tbdp disintergrating tablet  Commonly known as:  ANASPAZ  Place 0.125 mg under the tongue every 4 (four) hours as needed.     hyoscyamine 0.375 MG 12 hr tablet  Commonly known as:  LEVBID  Take 0.375 mg by mouth at bedtime.     NU-IRON 150 MG capsule  Generic drug:  iron polysaccharides  Take 150 mg by mouth daily.     omeprazole 20 MG capsule  Commonly known as:  PRILOSEC     QSYMIA 7.5-46 MG Cp24  Generic drug:  Phentermine-Topiramate        Allergies:  Allergies  Allergen Reactions  . Penicillins Hives and Rash  . Dairy Aid [Lactase] Other (See Comments)   unknown  . Shellfish-Derived Products Itching    Past Medical History, Surgical history, Social history, and Family History were reviewed and updated.  Review of Systems: All other 10 point review of systems is negative.   Physical Exam:  height is 5\' 3"  (1.6 m) and weight is 181 lb (82.101 kg). Her oral temperature is 97.9 F (36.6 C). Her blood pressure is 116/77 and her pulse is 62. Her respiration is 16.   Wt Readings from Last 3 Encounters:  07/16/15 181 lb (82.101 kg)  05/28/15 186 lb (84.369 kg)  04/08/15 192 lb 1.9 oz (87.145 kg)    Ocular: Sclerae unicteric, pupils equal, round and reactive to light Ear-nose-throat: Oropharynx clear, dentition fair Lymphatic: No cervical or supraclavicular adenopathy Lungs no rales or rhonchi, good excursion bilaterally Heart regular rate and rhythm, no murmur appreciated Abd soft, nontender, positive bowel sounds MSK no focal spinal tenderness, no joint edema Neuro: non-focal, well-oriented, appropriate affect Breasts: Deferred  Lab Results  Component Value Date   WBC 4.9 07/05/2015   HGB 13.8 07/05/2015   HCT 41.6 07/05/2015   MCV 78* 07/05/2015   PLT 290 07/05/2015   Lab Results  Component Value Date   FERRITIN 61 07/05/2015  IRON 79 07/05/2015   TIBC 285 07/05/2015   UIBC 206 07/05/2015   IRONPCTSAT 28 07/05/2015   Lab Results  Component Value Date   RETICCTPCT 0.9 07/05/2015   RBC 5.35* 07/05/2015   RBC 5.43* 07/05/2015   RETICCTABS 48.9 07/05/2015   No results found for: KPAFRELGTCHN, LAMBDASER, KAPLAMBRATIO No results found for: IGGSERUM, IGA, IGMSERUM No results found for: Ronnald Ramp, A1GS, A2GS, Violet Baldy, MSPIKE, SPEI   Chemistry      Component Value Date/Time   NA 138 08/12/2009 2326   K 3.9 08/12/2009 2326   CL 105 08/12/2009 2326   BUN 13 08/12/2009 2326   CREATININE 1.0 08/12/2009 2326   No results found for: CALCIUM, ALKPHOS, AST, ALT, BILITOT   Impression and Plan: Nicole Booth is a very pleasant African American female with history of iron deficiency. She has had blood in her stool in the past but she states that her GI and Surgery work-ups have been negative. She has an appointment with the [facility] clinic in September for a second opinion.  Her energy has improved and symptoms have resolved since she received 2 doses of Feraheme in June. Her ferritin is now 61 and iron saturation 28%.  She takes her folic acid daily.  We will plan to see her back in 6 weeks for labs and follow-up.  She knows to contact us with any questions or concerns. We can certainly see her much sooner if necessary.  Eliezer Bottom, NP 8/9/20162:12 PM

## 2015-07-31 ENCOUNTER — Ambulatory Visit
Admission: RE | Admit: 2015-07-31 | Discharge: 2015-07-31 | Disposition: A | Discharge status: Home / Self Care | Payer: Federal, State, Local not specified - PPO | Source: Ambulatory Visit

## 2015-07-31 DIAGNOSIS — Z1231 Encounter for screening mammogram for malignant neoplasm of breast: Secondary | ICD-10-CM

## 2015-08-01 ENCOUNTER — Ambulatory Visit
Admission: RE | Admit: 2015-08-01 | Discharge: 2015-08-01 | Disposition: A | Discharge status: Home / Self Care | Payer: Federal, State, Local not specified - PPO | Source: Ambulatory Visit | Attending: Internal Medicine | Admitting: Internal Medicine

## 2015-08-01 ENCOUNTER — Other Ambulatory Visit: Payer: Self-pay | Admitting: Internal Medicine

## 2015-08-01 DIAGNOSIS — Z1231 Encounter for screening mammogram for malignant neoplasm of breast: Secondary | ICD-10-CM

## 2015-08-19 ENCOUNTER — Telehealth: Payer: Self-pay | Admitting: Hematology & Oncology

## 2015-08-19 NOTE — Telephone Encounter (Signed)
Patient cancelled 08/28/2015 appointment, and rescheduled for 09/12/2015.      [facility].

## 2015-08-28 ENCOUNTER — Other Ambulatory Visit: Payer: Federal, State, Local not specified - PPO

## 2015-08-28 ENCOUNTER — Ambulatory Visit: Payer: Federal, State, Local not specified - PPO | Admitting: Hematology & Oncology

## 2015-09-12 ENCOUNTER — Ambulatory Visit (HOSPITAL_BASED_OUTPATIENT_CLINIC_OR_DEPARTMENT_OTHER): Payer: Federal, State, Local not specified - PPO | Admitting: Family

## 2015-09-12 ENCOUNTER — Other Ambulatory Visit (HOSPITAL_BASED_OUTPATIENT_CLINIC_OR_DEPARTMENT_OTHER): Payer: Federal, State, Local not specified - PPO

## 2015-09-12 ENCOUNTER — Encounter: Payer: Self-pay | Admitting: Hematology & Oncology

## 2015-09-12 VITALS — BP 113/66 | HR 62 | Temp 97.9°F | Resp 16 | Ht 63.0 in | Wt 171.0 lb

## 2015-09-12 DIAGNOSIS — K909 Intestinal malabsorption, unspecified: Secondary | ICD-10-CM

## 2015-09-12 DIAGNOSIS — D509 Iron deficiency anemia, unspecified: Secondary | ICD-10-CM

## 2015-09-12 DIAGNOSIS — N921 Excessive and frequent menstruation with irregular cycle: Secondary | ICD-10-CM

## 2015-09-12 LAB — CBC WITH DIFFERENTIAL (CANCER CENTER ONLY)
BASO#: 0 10*3/uL (ref 0.0–0.2)
BASO%: 0.7 % (ref 0.0–2.0)
EOS%: 4.8 % (ref 0.0–7.0)
Eosinophils Absolute: 0.2 10*3/uL (ref 0.0–0.5)
HCT: 38.7 % (ref 34.8–46.6)
HGB: 13.1 g/dL (ref 11.6–15.9)
LYMPH#: 1.2 10*3/uL (ref 0.9–3.3)
LYMPH%: 26.3 % (ref 14.0–48.0)
MCH: 28.6 pg (ref 26.0–34.0)
MCHC: 33.9 g/dL (ref 32.0–36.0)
MCV: 85 fL (ref 81–101)
MONO#: 0.4 10*3/uL (ref 0.1–0.9)
MONO%: 8.3 % (ref 0.0–13.0)
NEUT#: 2.7 10*3/uL (ref 1.5–6.5)
NEUT%: 59.9 % (ref 39.6–80.0)
Platelets: 240 10*3/uL (ref 145–400)
RBC: 4.58 10*6/uL (ref 3.70–5.32)
RDW: 13.9 % (ref 11.1–15.7)
WBC: 4.6 10*3/uL (ref 3.9–10.0)

## 2015-09-12 LAB — IRON AND TIBC CHCC
%SAT: 31 % (ref 21–57)
Iron: 102 ug/dL (ref 41–142)
TIBC: 331 ug/dL (ref 236–444)
UIBC: 230 ug/dL (ref 120–384)

## 2015-09-12 LAB — RETICULOCYTES (CHCC)
ABS Retic: 51.9 10*3/uL (ref 19.0–186.0)
RBC.: 4.72 MIL/uL (ref 3.87–5.11)
Retic Ct Pct: 1.1 % (ref 0.4–2.3)

## 2015-09-12 LAB — FERRITIN CHCC: Ferritin: 20 ng/ml (ref 9–269)

## 2015-09-12 NOTE — Progress Notes (Signed)
Hematology and Oncology Follow Up Visit  Nicole Booth [id number] 06-Sep-1976 39 y.o. 09/12/2015   Principle Diagnosis:  Iron deficiency Anemia  Current Therapy:   IV iron as indicated     Interim History:  Ms. Nicole Booth is here today for a follow-up. She is felling a bit fatigued at times. She went to the [facility] clinic and has had multiple tests done to figure out why she is having GI issues. She states that the are thinking she has an AV malformation. She goes back next week to have a double balloon enteroscopy.  She states that her cycles have been heavier and that she has had "lots of clots." They have been lasting 7-8 days.  She has had no bruising or petechiae.  Her Hgb is holding at 13.1 with an MCV of 85. She is taking her folic acid daily.    Her last dose of Feraheme was in July. Her ferritin last week while at the [facility] clinic was 11.  No fever, chills, n/v, cough, rash, dizziness, SOB, chest pain, palpitations or changes in bowel of bladder habits. No blood in her stool.  She has had no swelling or tenderness in her extremities. She has some numbness and tingling in her hands at times.  She is eating healthy and staying hydrated. She is down 10 lbs since her last visit.   Medications:    Medication List       This list is accurate as of: 09/12/15 11:58 AM.  Always use your most recent med list.               folic acid 1 MG tablet  Commonly known as:  FOLVITE  Take 1 tablet (1 mg total) by mouth daily.     hyoscyamine 0.125 MG Tbdp disintergrating tablet  Commonly known as:  ANASPAZ  Place 0.125 mg under the tongue every 4 (four) hours as needed.     hyoscyamine 0.375 MG 12 hr tablet  Commonly known as:  LEVBID  Take 0.375 mg by mouth at bedtime.     NU-IRON 150 MG capsule  Generic drug:  iron polysaccharides  Take 150 mg by mouth daily.     omeprazole 20 MG capsule  Commonly known as:  PRILOSEC     QSYMIA 7.5-46 MG Cp24  Generic drug:   Phentermine-Topiramate        Allergies:  Allergies  Allergen Reactions  . Penicillins Hives and Rash  . Dairy Aid [Lactase] Other (See Comments)    unknown  . Shellfish-Derived Products Itching    Past Medical History, Surgical history, Social history, and Family History were reviewed and updated.  Review of Systems: All other 10 point review of systems is negative.   Physical Exam:  height is 5\' 3"  (1.6 m) and weight is 171 lb (77.565 kg). Her oral temperature is 97.9 F (36.6 C). Her blood pressure is 113/66 and her pulse is 62. Her respiration is 16.   Wt Readings from Last 3 Encounters:  09/12/15 171 lb (77.565 kg)  07/16/15 181 lb (82.101 kg)  05/28/15 186 lb (84.369 kg)    Ocular: Sclerae unicteric, pupils equal, round and reactive to light Ear-nose-throat: Oropharynx clear, dentition fair Lymphatic: No cervical or supraclavicular adenopathy Lungs no rales or rhonchi, good excursion bilaterally Heart regular rate and rhythm, no murmur appreciated Abd soft, nontender, positive bowel sounds MSK no focal spinal tenderness, no joint edema Neuro: non-focal, well-oriented, appropriate affect Breasts: Deferred  Lab Results  Component Value  Date   WBC 4.6 09/12/2015   HGB 13.1 09/12/2015   HCT 38.7 09/12/2015   MCV 85 09/12/2015   PLT 240 09/12/2015   Lab Results  Component Value Date   FERRITIN 61 07/05/2015   IRON 79 07/05/2015   TIBC 285 07/05/2015   UIBC 206 07/05/2015   IRONPCTSAT 28 07/05/2015   Lab Results  Component Value Date   RETICCTPCT 0.9 07/05/2015   RBC 4.58 09/12/2015   RETICCTABS 48.9 07/05/2015   No results found for: KPAFRELGTCHN, LAMBDASER, KAPLAMBRATIO No results found for: IGGSERUM, IGA, IGMSERUM No results found for: Ronnald Ramp, A1GS, A2GS, Tillman Sers, SPEI   Chemistry      Component Value Date/Time   NA 138 08/12/2009 2326   K 3.9 08/12/2009 2326   CL 105 08/12/2009 2326   BUN 13 08/12/2009  2326   CREATININE 1.0 08/12/2009 2326   No results found for: CALCIUM, ALKPHOS, AST, ALT, BILITOT   Impression and Plan: Ms. Nicole Booth is a very pleasant African American female with history of iron deficiency. She is having some fatigue and her iron studies were low last week despite having a Hgb of 13.1.  She would like to come back for iron tomorrow. This will be fine and we will also check a Von Willebrand panel on her at that time.  She will continue to take her folic acid daily.  We will plan to see her back in 6 weeks for labs and follow-up.  She knows to contact us with any questions or concerns. We can certainly see her much sooner if necessary.  Eliezer Bottom, NP 10/6/201611:58 AM

## 2015-09-16 ENCOUNTER — Other Ambulatory Visit: Payer: Self-pay | Admitting: Family

## 2015-09-16 ENCOUNTER — Other Ambulatory Visit (HOSPITAL_BASED_OUTPATIENT_CLINIC_OR_DEPARTMENT_OTHER): Payer: Federal, State, Local not specified - PPO

## 2015-09-16 ENCOUNTER — Ambulatory Visit (HOSPITAL_BASED_OUTPATIENT_CLINIC_OR_DEPARTMENT_OTHER): Payer: Federal, State, Local not specified - PPO

## 2015-09-16 VITALS — BP 106/71 | HR 62 | Temp 97.7°F | Resp 16

## 2015-09-16 DIAGNOSIS — N921 Excessive and frequent menstruation with irregular cycle: Secondary | ICD-10-CM

## 2015-09-16 DIAGNOSIS — K909 Intestinal malabsorption, unspecified: Secondary | ICD-10-CM

## 2015-09-16 DIAGNOSIS — D509 Iron deficiency anemia, unspecified: Secondary | ICD-10-CM

## 2015-09-16 LAB — CBC WITH DIFFERENTIAL (CANCER CENTER ONLY)
BASO#: 0 10*3/uL (ref 0.0–0.2)
BASO%: 0.2 % (ref 0.0–2.0)
EOS%: 5.1 % (ref 0.0–7.0)
Eosinophils Absolute: 0.2 10*3/uL (ref 0.0–0.5)
HCT: 40.6 % (ref 34.8–46.6)
HGB: 13.7 g/dL (ref 11.6–15.9)
LYMPH#: 1.2 10*3/uL (ref 0.9–3.3)
LYMPH%: 27.2 % (ref 14.0–48.0)
MCH: 28.7 pg (ref 26.0–34.0)
MCHC: 33.7 g/dL (ref 32.0–36.0)
MCV: 85 fL (ref 81–101)
MONO#: 0.3 10*3/uL (ref 0.1–0.9)
MONO%: 6.5 % (ref 0.0–13.0)
NEUT#: 2.7 10*3/uL (ref 1.5–6.5)
NEUT%: 61 % (ref 39.6–80.0)
Platelets: 272 10*3/uL (ref 145–400)
RBC: 4.77 10*6/uL (ref 3.70–5.32)
RDW: 14 % (ref 11.1–15.7)
WBC: 4.5 10*3/uL (ref 3.9–10.0)

## 2015-09-16 LAB — RETICULOCYTES (CHCC)
ABS Retic: 62.9 10*3/uL (ref 19.0–186.0)
RBC.: 4.84 MIL/uL (ref 3.87–5.11)
Retic Ct Pct: 1.3 % (ref 0.4–2.3)

## 2015-09-16 MED ORDER — DIPHENHYDRAMINE HCL 50 MG/ML IJ SOLN: 25.0000 mg | Freq: Once | INTRAMUSCULAR | Status: DC

## 2015-09-16 MED ORDER — FAMOTIDINE IN NACL 20-0.9 MG/50ML-% IV SOLN
20.0000 mg | Freq: Two times a day (BID) | INTRAVENOUS | Status: DC
  Administered 2015-09-16: 20 mg via INTRAVENOUS

## 2015-09-16 MED ORDER — SODIUM CHLORIDE 0.9 % IV SOLN
INTRAVENOUS | Status: DC
  Administered 2015-09-16: 14:00:00 via INTRAVENOUS

## 2015-09-16 MED ORDER — DIPHENHYDRAMINE HCL 25 MG PO CAPS
ORAL_CAPSULE | ORAL | Status: AC
Start: 2015-09-16 — End: 2015-09-16
  Filled 2015-09-16: qty 1

## 2015-09-16 MED ORDER — FAMOTIDINE IN NACL 20-0.9 MG/50ML-% IV SOLN
INTRAVENOUS | Status: AC
  Filled 2015-09-16: qty 50

## 2015-09-16 MED ORDER — DIPHENHYDRAMINE HCL 25 MG PO CAPS
25.0000 mg | ORAL_CAPSULE | Freq: Once | ORAL | Status: AC
  Administered 2015-09-16: 25 mg via ORAL

## 2015-09-16 MED ORDER — SODIUM CHLORIDE 0.9 % IV SOLN
510.0000 mg | Freq: Once | INTRAVENOUS | Status: AC
  Administered 2015-09-16: 510 mg via INTRAVENOUS
  Filled 2015-09-16: qty 17

## 2015-09-16 NOTE — Patient Instructions (Signed)

## 2015-09-16 NOTE — Progress Notes (Signed)
Patient refused to stay thirty minutes after iron infusion. She had another doctors appointment at [facility] and had to leave. Vitals checked which were stable. Patient dc'd with friend.

## 2015-09-17 LAB — IRON AND TIBC CHCC
%SAT: 15 % — ABNORMAL LOW (ref 21–57)
Iron: 52 ug/dL (ref 41–142)
TIBC: 350 ug/dL (ref 236–444)
UIBC: 298 ug/dL (ref 120–384)

## 2015-09-17 LAB — FERRITIN CHCC: Ferritin: 15 ng/ml (ref 9–269)

## 2015-09-19 LAB — VON WILLEBRAND PANEL
Coagulation Factor VIII: 173 % (ref 50–180)
Ristocetin Co-factor, Plasma: 158 % (ref 42–200)
Von Willebrand Antigen, Plasma: 185 % (ref 50–217)

## 2015-10-24 ENCOUNTER — Ambulatory Visit: Payer: Federal, State, Local not specified - PPO | Admitting: Family

## 2015-10-24 ENCOUNTER — Other Ambulatory Visit: Payer: Federal, State, Local not specified - PPO

## 2016-01-08 ENCOUNTER — Other Ambulatory Visit: Payer: Federal, State, Local not specified - PPO

## 2016-01-08 ENCOUNTER — Ambulatory Visit: Payer: Federal, State, Local not specified - PPO | Admitting: Hematology & Oncology

## 2016-05-01 ENCOUNTER — Telehealth: Payer: Self-pay | Admitting: *Deleted

## 2016-05-01 NOTE — Telephone Encounter (Signed)
Medical Records Routed to:  [facility] [address] [place] [address] [phone]  Signed medical records release form placed into scan bin

## 2016-06-02 ENCOUNTER — Other Ambulatory Visit (HOSPITAL_COMMUNITY)
Admission: RE | Admit: 2016-06-02 | Discharge: 2016-06-02 | Disposition: A | Discharge status: Home / Self Care | Payer: Federal, State, Local not specified - PPO | Source: Ambulatory Visit | Attending: Obstetrics and Gynecology | Admitting: Obstetrics and Gynecology

## 2016-06-02 ENCOUNTER — Other Ambulatory Visit: Payer: Self-pay | Admitting: Obstetrics and Gynecology

## 2016-06-02 DIAGNOSIS — Z1151 Encounter for screening for human papillomavirus (HPV): Secondary | ICD-10-CM | POA: Diagnosis present

## 2016-06-02 DIAGNOSIS — Z01411 Encounter for gynecological examination (general) (routine) with abnormal findings: Secondary | ICD-10-CM | POA: Insufficient documentation

## 2016-06-04 LAB — CYTOLOGY - PAP

## 2016-08-06 ENCOUNTER — Other Ambulatory Visit (HOSPITAL_COMMUNITY): Payer: Self-pay | Admitting: Obstetrics and Gynecology

## 2016-08-07 ENCOUNTER — Encounter (HOSPITAL_COMMUNITY): Payer: Self-pay

## 2016-08-07 ENCOUNTER — Encounter (HOSPITAL_COMMUNITY)
Admission: RE | Admit: 2016-08-07 | Discharge: 2016-08-07 | Disposition: A | Discharge status: Home / Self Care | Payer: Federal, State, Local not specified - PPO | Source: Ambulatory Visit | Attending: Obstetrics and Gynecology | Admitting: Obstetrics and Gynecology

## 2016-08-07 DIAGNOSIS — Z01812 Encounter for preprocedural laboratory examination: Secondary | ICD-10-CM | POA: Diagnosis not present

## 2016-08-07 HISTORY — DX: Unspecified osteoarthritis, unspecified site: M19.90

## 2016-08-07 HISTORY — DX: Anxiety disorder, unspecified: F41.9

## 2016-08-07 HISTORY — DX: Other specified postprocedural states: Z98.890

## 2016-08-07 HISTORY — DX: Other specified postprocedural states: R11.2

## 2016-08-07 HISTORY — DX: Ocular hypertension, unspecified eye: H40.059

## 2016-08-07 LAB — CBC
HCT: 37.1 % (ref 36.0–46.0)
Hemoglobin: 12.4 g/dL (ref 12.0–15.0)
MCH: 26.8 pg (ref 26.0–34.0)
MCHC: 33.4 g/dL (ref 30.0–36.0)
MCV: 80.1 fL (ref 78.0–100.0)
Platelets: 287 10*3/uL (ref 150–400)
RBC: 4.63 MIL/uL (ref 3.87–5.11)
RDW: 13.9 % (ref 11.5–15.5)
WBC: 4.8 10*3/uL (ref 4.0–10.5)

## 2016-08-07 LAB — TYPE AND SCREEN
ABO/RH(D): B POS
Antibody Screen: NEGATIVE

## 2016-08-07 LAB — BASIC METABOLIC PANEL
Anion gap: 5 (ref 5–15)
BUN: 8 mg/dL (ref 6–20)
CO2: 25 mmol/L (ref 22–32)
Calcium: 9.2 mg/dL (ref 8.9–10.3)
Chloride: 106 mmol/L (ref 101–111)
Creatinine, Ser: 0.85 mg/dL (ref 0.44–1.00)
GFR calc Af Amer: >60 mL/min (ref >60)
GFR calc non Af Amer: >60 mL/min (ref >60)
Glucose, Bld: 81 mg/dL (ref 65–99)
Potassium: 4 mmol/L (ref 3.5–5.1)
Sodium: 136 mmol/L (ref 135–145)

## 2016-08-07 NOTE — Patient Instructions (Signed)
Your procedure is scheduled on:  Wednesday, Sept. 13, 2017  Enter through the [employer] [facility] at:  7:00 AM  Pick up the phone at the desk and dial [phone].  Call this number if you have problems the morning of surgery: [phone].  Remember: Do NOT eat food or drink after:  Midnight Tuesday, Sept. 12, 2017  Take these medicines the morning of surgery with a SIP OF WATER:  Pantoprazole  Do NOT wear jewelry (body piercing), metal hair clips/bobby pins, make-up, or nail polish. Do NOT wear lotions, powders, or perfumes.  You may wear deodorant. Do NOT shave for 48 hours prior to surgery. Do NOT bring valuables to the hospital. Contacts, dentures, or bridgework may not be worn into surgery.  Leave suitcase in car.  After surgery it may be brought to your room.  For patients admitted to the hospital, checkout time is 11:00 AM the day of discharge.

## 2016-08-18 ENCOUNTER — Other Ambulatory Visit (HOSPITAL_COMMUNITY): Payer: Self-pay | Admitting: Obstetrics and Gynecology

## 2016-08-18 MED ORDER — CIPROFLOXACIN IN D5W 400 MG/200ML IV SOLN
400.0000 mg | INTRAVENOUS | Status: AC
  Administered 2016-08-19: 400 mg via INTRAVENOUS
  Filled 2016-08-18: qty 200

## 2016-08-18 MED ORDER — CLINDAMYCIN PHOSPHATE 900 MG/50ML IV SOLN
900.0000 mg | INTRAVENOUS | Status: AC
  Administered 2016-08-19: 900 mg via INTRAVENOUS
  Filled 2016-08-18: qty 50

## 2016-08-18 NOTE — H&P (Signed)
Subjective: Chief Complaint(s):   PreOp for 08/19/16   HPI:  General 40 y/o presents for preop history and physical examination in preparation for robotic assisted laparoscopic hysterectomy with bilateral salpingectomy to treat menorrhagia/ fibroids/ anemia. her menses that last for greater than 7 days for the last year. her menses started getting heavier when she started iron transfusion. she changes every hour on her heaviest days. she has 2 heavy days out of 7. she denies intermenstrual bleeding.  Her ultrasound from 06/30/2016 shows a 9.2 cm x 5.8 cm x 5.5 cm uterus. the endometrium is 1.34 cm. Her ovaries appear normal bilaterally . She has a small anterior fibroid that is 0.7 cm.  she is complaining of urinary frequency for the last week.  Current Medication:  Taking  Ibuprofen 800 MG Tablet 1 tablet Orally Three times a day     Hyoscyamine           Medical History:   thyroid goiter     iron deficient anemia     vitamin D deficiency     reflux     migraine headaches     IBS     carpal tunnel     muscle spasm     knee pain, bilateral      Allergies/Intolerance:   Penicillin (for allergy): Allergy - rash     shellfish : Allergy - unknown     Dairy: Allergy - unknown   Gyn History:   H/O Sexual activity currently sexually active. H/O Periods : every month, regular. H/O LMP 06/15/16. Denies Birth control BTL. H/O Last pap smear date 01/22/15. H/O Last mammogram date 07/2015. Denies Abnormal pap smear no. Denies STD. H/O Menarche 20. H/O GYN procedures laporoscopy- Tubes were blocked.   OB History:   Number of pregnancies 2. Pregnancy # 1 live birth, vaginal delivery. Pregnancy # 2 03/29/10-live birth, birth weight 9.1, boy "Camren", C-section.   Surgical History:   Laproscopy 2002     Hernia Repair umbilical [id number]     C-section 03/2010     BTL   Hospitalization:   surgery     childbirth     not in past yr 07/2014   Family History:   Father: alive 58 yrs,  diabetes, hypertension    Mother: alive 49 yrs, Fibromyalgia    Brother 1: alive 35 yrs, healthy    Sister 1: alive 33 yrs, healthy    Paternal aunt: Breast cancer    Maternal aunt: ovarian or cervical cancer    1 brother(s) , 1 sister(s) . 2 son(s) .    Paternal aunt stomach cancer at age 31. Still living/dc.  Social History:  General Tobacco use cigarettes: Never smoked, Tobacco history last updated 08/04/2016.  no EXPOSURE TO PASSIVE SMOKE.  Alcohol: yes, Rare.  Caffeine: yes, coffee, tea, soda.  DIET: yes, Regular, drinks water.  Exercise: yes, regularly, daily, 30 min daily, walks.  Marital Status: married.  Children: 2.  OCCUPATION: employed, Chartered certified accountant.  Seat belt use: yes.  ROS: CONSTITUTIONAL yes" label="Fatigue" value="" options="no,yes" propid="91" itemid="[id number]" categoryid="[id number]" encounterid="[id number]"Fatigue yes. none today" label="Fever" value="" options="no,yes" propid="91" itemid="10467" categoryid="10464" encounterid="[id number]"Fever none today.  CARDIOLOGY none" label="Chest pain" value="" options="no,yes" propid="91" itemid="[id number]" categoryid="10488" encounterid="[id number]"Chest pain none.  RESPIRATORY no" label="Shortness of breath" value="" options="no" propid="91" itemid="270013" categoryid="138132" encounterid="[id number]"Shortness of breath no. no" label="Cough" value="" options="no,yes" propid="91" itemid="172745" categoryid="138132" encounterid="[id number]"Cough no.  GASTROENTEROLOGY none" label="Appetite change" value="" options="no,yes" propid="91" itemid="193447" categoryid="10494" encounterid="[id number]"Appetite change none. no" label="Change in bowel habits" value="" options="no,yes" propid="91" itemid="193449" categoryid="10494" encounterid="[id number]"Change  in bowel habits no.  FEMALE REPRODUCTIVE no" label="Breast lumps or discharge" value="" options="no,yes" propid="91" itemid="196298" categoryid="10525" encounterid="[id number]"Breast lumps or discharge no.  none" label="Breast pain" value="" options="no,yes" propid="91" itemid="186083" categoryid="10525" encounterid="[id number]"Breast pain none. none" label="Dyspareunia" value="" options="no,yes" propid="91" itemid="138198" categoryid="10525" encounterid="[id number]"Dyspareunia none. no" label="Dysuria" value="" options="no,yes" propid="91" itemid="202654" categoryid="10525" encounterid="[id number]"Dysuria no. none" label="Pelvic pain" value="" options="no,yes" propid="91" itemid="186082" categoryid="10525" encounterid="[id number]"Pelvic pain none. yes but heavy " label="Regular menses" value="" options="no,yes" propid="91" itemid="199173" categoryid="10525" encounterid="[id number]"Regular menses yes but heavy . no" label="Unusual vaginal discharge" value="" options="no,yes" propid="91" itemid="278230" categoryid="10525" encounterid="[id number]"Unusual vaginal discharge no. no" label="Vaginal itching" value="" options="no,yes" propid="91" itemid="278942" categoryid="10525" encounterid="[id number]"Vaginal itching no. no" label="Vulvar/labial lesion" value="" options="no,yes" propid="91" itemid="278837" categoryid="10525" encounterid="[id number]"Vulvar/labial lesion no.  NEUROLOGY none" label="Migraines" value="" options="no,yes" propid="91" itemid="193627" categoryid="12512" encounterid="[id number]"Migraines none. none" label="Tingling/numbness" value="" options="no,yes" propid="91" itemid="12514" categoryid="12512" encounterid="[id number]"Tingling/numbness none. none" label="Visual changes" value="" options="no,yes" propid="91" itemid="193467" categoryid="12512" encounterid="[id number]"Visual changes none.  PSYCHOLOGY no" label="Depression" value="" options="" propid="91" itemid="275919" categoryid="10520" encounterid="[id number]"Depression no.  SKIN no" label="Rash" value="" options="no,yes" propid="91" itemid="269383" categoryid="202750" encounterid="[id number]"Rash no. no" label="Suspicious lesions" value="" options="no,yes" propid="91"  itemid="202757" categoryid="202750" encounterid="[id number]"Suspicious lesions no.  ENDOCRINOLOGY none" label="Hot flashes" value="" options="no,yes" propid="91" itemid="202624" categoryid="12508" encounterid="[id number]"Hot flashes none. no unintentional" label="Weight gain" value="" options="no,yes" propid="91" itemid="193436" categoryid="12508" encounterid="[id number]"Weight gain no unintentional. none" label="Weight loss" value="" options="no,yes" propid="91" itemid="138164" categoryid="12508" encounterid="[id number]"Weight loss none.  HEMATOLOGY/LYMPH yes" label="Anemia" value="" options="no,yes" propid="91" itemid="193454" categoryid="138157" encounterid="[id number]"Anemia yes.    Objective: Vitals:  Wt 199, Wt change -6 lb, Pulse sitting 68, BP sitting 109/69  Past Results:  Examination:  General Examination McCoy,Tiffany 08/04/2016 10:04:01 AM &gt; , for female genital exam" label="CHAPERONE PRESENT" categoryPropId="[id number]" examid="[id number]"CHAPERONE PRESENT McCoy,Tiffany 08/04/2016 10:04:01 AM > , for female genital exam.  Physical Examination: GENERAL in NAD, pleasant" label="Patient appears"Patient appears in NAD, pleasant. well developed" label="Build:"Build: well developed. overweight" label="General Appearance:"General Appearance: overweight. african-american" label="Race:"Race: african-american.  LUNGS clear to auscultation" label="Breath sounds:"Breath sounds: clear to auscultation. no" label="Dyspnea:"Dyspnea: no.  HEART none" label="Murmurs:"Murmurs: none. normal" label="Rate:"Rate: normal. regular" label="Rhythm:"Rhythm: regular.  ABDOMEN no masses,tenderness,organomegaly, no CVAT" label="General:"General: no masses,tenderness,organomegaly, no CVAT.  FEMALE GENITOURINARY no mass, non tender" label="Adnexa:"Adnexa: no mass, non tender. normal, no lesions" label="Anus/perineum:"Anus/perineum: normal, no lesions. normal appearance , no lesions/discharge/bleeding, , good pelvic support , external  os normal " label="Cervix/ cuff:"Cervix/ cuff: normal appearance , no lesions/discharge/bleeding, , good pelvic support , external os normal . normal, no lesions, no skin discoloration, no lymphadenopathy" label="External genitalia:"External genitalia: normal, no lesions, no skin discoloration, no lymphadenopathy. normal external meatus" label="Urethra:"Urethra: normal external meatus. 10 wk size , freely mobile, non tender" label="Uterus:"Uterus: 10 wk size , freely mobile, non tender. deferred" label="Rectum:"Rectum: deferred. pink/moist mucosa, no lesions, no abnormal discharge, odorless" label="Vagina:"Vagina: pink/moist mucosa, no lesions, no abnormal discharge, odorless. normal, no lesions, no skin discoloration, non tender" label="Vulva:"Vulva: normal, no lesions, no skin discoloration, non tender.  EXTREMITIES FROM of all extremities" label="Extremities"Extremities FROM of all extremities.  NEUROLOGICAL normal" label="Gait:"Gait: normal. alert and oriented x 3" label="Orientation:"Orientation: alert and oriented x 3.    Assessment: Assessment:  Menorrhagia with regular cycle - N92.0 (Primary)     Urinary frequency - R35.0     Intramural leiomyoma of uterus - D25.1     Anemia, unspecified - D64.9     Plan: Treatment:  Menorrhagia with regular cycle  Notes: options of management were discussed with the patient she is interested in definitive therapy via robotic assisted laparoscopic hysterectomy and bilateral salpingectomy. . d/w pt r/o surgery including but not limited to infection bleeding damage to bowel bladder ureters with the need for further surgery.. we discussed r/o conversion  to abdominal hystectomy. we discused r/o blood transfusion... she voiced understanding and desires to proced with robotic assisted laparoscopic hysterectomy with bilateral salpingectomy.  Urinary frequency  [id number] Dip w/reflex to micro if positive  [id number], Urine Routine [id number])  Intramural  leiomyoma of uterus  Notes: options of management were discussed with the patient she is interested in definitive therapy via robotic assisted laparoscopic hysterectomy and bilateral salpingectomy. . d/w pt r/o surgery including but not limited to infection bleeding damage to bowel bladder ureters with the need for further surgery.. we discussed r/o conversion to abdominal hystectomy. we discused r/o blood transfusion... she voiced understanding and desires to proced with robotic assisted laparoscopic hysterectomy with bilateral salpingectomy.  Procedures:  Immunizations:  Therapeutic Injections:  Diagnostic Imaging:  Lab Reports:  [id number] Dip w/reflex to micro if positive  Specific Gravity -  1.020  1.010-1.030 -   pH -  6.5  5.0-8.0 -   Leukocyte Esterase -  NEGATIVE  Negative -   Blood -  NEGATIVE  Negative - ERY/UL  Glucose -  NEGATIVE  Negative - g/dL  Nitrite -  NEGATIVE  Negative -   Protein -  TRACE A Negative - mg/dL  Ketones -  TRACE A Negative - mg/dL  Urine Bilirubin -  NEGATIVE  Negative -   Urobilinogen -  1.0  0.0-1.0 - mg/dL    Clemie General 08/05/2016 10:39:56 AM > will await culture McCoy,Tiffany 08/06/2016 12:22:35 PM > pt aware  [id number], Urine Routine [id number]) Normal  Urine Culture, Routine -  Final report  -     Astria Jordahl 08/06/2016 11:09:40 AM > no uti McCoy,Tiffany 08/06/2016 12:22:35 PM > pt aware

## 2016-08-18 NOTE — Anesthesia Preprocedure Evaluation (Addendum)
Anesthesia Evaluation  Patient identified by MRN, date of birth, ID band Patient awake    Reviewed: Allergy & Precautions, NPO status , Patient's Chart, lab work & pertinent test results  History of Anesthesia Complications (+) PONV and history of anesthetic complications  Airway Mallampati: II  TM Distance: >3 FB Neck ROM: Full    Dental no notable dental hx.    Pulmonary neg pulmonary ROS,    Pulmonary exam normal breath sounds clear to auscultation       Cardiovascular + Peripheral Vascular Disease  Normal cardiovascular exam+ dysrhythmias Supra Ventricular Tachycardia  Rhythm:Regular Rate:Normal  Echo 5/16: Study Conclusions  - Left ventricle: The cavity size was normal. Systolic function was normal. Wall motion was normal; there were no regional wall motion abnormalities. Left ventricular diastolic function parameters were normal. - Mitral valve: There was mild regurgitation. - Atrial septum: No defect or patent foramen ovale was identified.   Neuro/Psych  Headaches, PSYCHIATRIC DISORDERS Anxiety    GI/Hepatic Neg liver ROS, GERD  Medicated,  Endo/Other  negative endocrine ROSObesity   Renal/GU negative Renal ROS     Musculoskeletal  (+) Arthritis , Osteoarthritis,    Abdominal   Peds  Hematology  (+) Blood dyscrasia, anemia ,   Anesthesia Other Findings Day of surgery medications reviewed with the patient.  Reproductive/Obstetrics menorrhagia/ fibroids/ anemia                            Anesthesia Physical Anesthesia Plan  ASA: II  Anesthesia Plan: General   Post-op Pain Management:    Induction: Intravenous  Airway Management Planned: Oral ETT  Additional Equipment:   Intra-op Plan:   Post-operative Plan: Extubation in OR  Informed Consent: I have reviewed the patients History and Physical, chart, labs and discussed the procedure including the risks, benefits and  alternatives for the proposed anesthesia with the patient or authorized representative who has indicated his/her understanding and acceptance.   Dental advisory given  Plan Discussed with: CRNA  Anesthesia Plan Comments: (Risks/benefits of general anesthesia discussed with patient including risk of damage to teeth, lips, gum, and tongue, nausea/vomiting, allergic reactions to medications, and the possibility of heart attack, stroke and death.  All patient questions answered.  Patient wishes to proceed.)        Anesthesia Quick Evaluation

## 2016-08-18 NOTE — H&P (Deleted)
  The note originally documented on this encounter has been moved the the encounter in which it belongs.  

## 2016-08-19 ENCOUNTER — Ambulatory Visit (HOSPITAL_COMMUNITY): Payer: Federal, State, Local not specified - PPO | Admitting: Anesthesiology

## 2016-08-19 ENCOUNTER — Observation Stay (HOSPITAL_COMMUNITY)
Admission: RE | Admit: 2016-08-19 | Discharge: 2016-08-20 | Disposition: A | Discharge status: Home / Self Care | Payer: Federal, State, Local not specified - PPO | Source: Ambulatory Visit | Attending: Obstetrics and Gynecology | Admitting: Obstetrics and Gynecology

## 2016-08-19 ENCOUNTER — Encounter (HOSPITAL_COMMUNITY): Payer: Self-pay | Admitting: Anesthesiology

## 2016-08-19 ENCOUNTER — Encounter (HOSPITAL_COMMUNITY)
Admission: RE | Disposition: A | Discharge status: Home / Self Care | Payer: Self-pay | Source: Ambulatory Visit | Attending: Obstetrics and Gynecology

## 2016-08-19 DIAGNOSIS — N920 Excessive and frequent menstruation with regular cycle: Secondary | ICD-10-CM | POA: Diagnosis present

## 2016-08-19 DIAGNOSIS — Z90710 Acquired absence of both cervix and uterus: Secondary | ICD-10-CM | POA: Diagnosis not present

## 2016-08-19 DIAGNOSIS — D251 Intramural leiomyoma of uterus: Secondary | ICD-10-CM | POA: Diagnosis present

## 2016-08-19 HISTORY — PX: ROBOTIC ASSISTED TOTAL HYSTERECTOMY WITH SALPINGECTOMY: SHX6679

## 2016-08-19 LAB — PREGNANCY, URINE: Preg Test, Ur: NEGATIVE

## 2016-08-19 SURGERY — ROBOTIC ASSISTED TOTAL HYSTERECTOMY WITH SALPINGECTOMY
Anesthesia: General | Site: Abdomen | Laterality: Bilateral

## 2016-08-19 MED ORDER — IBUPROFEN 800 MG PO TABS: 800.0000 mg | ORAL_TABLET | Freq: Three times a day (TID) | ORAL | Status: DC | PRN

## 2016-08-19 MED ORDER — INFLUENZA VAC SPLIT QUAD 0.5 ML IM SUSY
0.5000 mL | PREFILLED_SYRINGE | INTRAMUSCULAR | Status: DC
Start: 2016-08-20 — End: 2016-08-20

## 2016-08-19 MED ORDER — ZOLPIDEM TARTRATE 5 MG PO TABS: 5.0000 mg | ORAL_TABLET | Freq: Every evening | ORAL | Status: DC | PRN

## 2016-08-19 MED ORDER — ONDANSETRON HCL 4 MG PO TABS: 4.0000 mg | ORAL_TABLET | Freq: Four times a day (QID) | ORAL | Status: DC | PRN

## 2016-08-19 MED ORDER — ONDANSETRON HCL 4 MG/2ML IJ SOLN
4.0000 mg | Freq: Four times a day (QID) | INTRAMUSCULAR | Status: DC | PRN
Start: 2016-08-19 — End: 2016-08-20

## 2016-08-19 MED ORDER — MIDAZOLAM HCL 2 MG/2ML IJ SOLN
INTRAMUSCULAR | Status: DC | PRN
Start: 2016-08-19 — End: 2016-08-19
  Administered 2016-08-19: 2 mg via INTRAVENOUS

## 2016-08-19 MED ORDER — LIDOCAINE HCL (CARDIAC) 20 MG/ML IV SOLN
INTRAVENOUS | Status: DC | PRN
  Administered 2016-08-19: 70 mg via INTRAVENOUS
  Administered 2016-08-19: 30 mg via INTRAVENOUS

## 2016-08-19 MED ORDER — ROCURONIUM BROMIDE 100 MG/10ML IV SOLN
INTRAVENOUS | Status: DC | PRN
  Administered 2016-08-19: 10 mg via INTRAVENOUS
  Administered 2016-08-19: 50 mg via INTRAVENOUS

## 2016-08-19 MED ORDER — SIMETHICONE 80 MG PO CHEW
80.0000 mg | CHEWABLE_TABLET | Freq: Four times a day (QID) | ORAL | Status: DC | PRN
  Administered 2016-08-20: 80 mg via ORAL
  Filled 2016-08-19 (×2): qty 1

## 2016-08-19 MED ORDER — FLUMAZENIL 0.5 MG/5ML IV SOLN
INTRAVENOUS | Status: AC
  Filled 2016-08-19: qty 5

## 2016-08-19 MED ORDER — ONDANSETRON HCL 4 MG/2ML IJ SOLN
INTRAMUSCULAR | Status: DC | PRN
  Administered 2016-08-19: 10 mg via INTRAVENOUS

## 2016-08-19 MED ORDER — LACTATED RINGERS IR SOLN
Status: DC | PRN
  Administered 2016-08-19: 3000 mL

## 2016-08-19 MED ORDER — HYDROMORPHONE HCL 1 MG/ML IJ SOLN
0.2000 mg | INTRAMUSCULAR | Status: DC | PRN
  Administered 2016-08-19 (×2): 0.6 mg via INTRAVENOUS
  Filled 2016-08-19 (×2): qty 1

## 2016-08-19 MED ORDER — SODIUM CHLORIDE 0.9 % IJ SOLN
INTRAMUSCULAR | Status: AC
  Filled 2016-08-19: qty 10

## 2016-08-19 MED ORDER — KETOROLAC TROMETHAMINE 30 MG/ML IJ SOLN
INTRAMUSCULAR | Status: DC | PRN
  Administered 2016-08-19: 30 mg via INTRAVENOUS

## 2016-08-19 MED ORDER — PHENYLEPHRINE HCL 10 MG/ML IJ SOLN
INTRAMUSCULAR | Status: DC | PRN
  Administered 2016-08-19: 120 ug via INTRAVENOUS

## 2016-08-19 MED ORDER — ARTIFICIAL TEARS OP OINT
TOPICAL_OINTMENT | OPHTHALMIC | Status: AC
  Filled 2016-08-19: qty 3.5

## 2016-08-19 MED ORDER — FENTANYL CITRATE (PF) 100 MCG/2ML IJ SOLN
INTRAMUSCULAR | Status: AC
Start: 2016-08-19 — End: 2016-08-20
  Filled 2016-08-19: qty 2

## 2016-08-19 MED ORDER — SUGAMMADEX SODIUM 500 MG/5ML IV SOLN
INTRAVENOUS | Status: DC | PRN
  Administered 2016-08-19: 363.6 mg via INTRAVENOUS

## 2016-08-19 MED ORDER — PHENYLEPHRINE 40 MCG/ML (10ML) SYRINGE FOR IV PUSH (FOR BLOOD PRESSURE SUPPORT)
PREFILLED_SYRINGE | INTRAVENOUS | Status: AC
  Filled 2016-08-19: qty 10

## 2016-08-19 MED ORDER — ROCURONIUM BROMIDE 100 MG/10ML IV SOLN
INTRAVENOUS | Status: AC
  Filled 2016-08-19: qty 1

## 2016-08-19 MED ORDER — SODIUM CHLORIDE 0.9 % IV SOLN
INTRAVENOUS | Status: DC | PRN
  Administered 2016-08-19: 11:00:00

## 2016-08-19 MED ORDER — MIDAZOLAM HCL 2 MG/2ML IJ SOLN
INTRAMUSCULAR | Status: AC
  Filled 2016-08-19: qty 2

## 2016-08-19 MED ORDER — OXYCODONE-ACETAMINOPHEN 5-325 MG PO TABS
1.0000 | ORAL_TABLET | ORAL | Status: DC | PRN
  Administered 2016-08-19: 2 via ORAL
  Filled 2016-08-19 (×2): qty 2

## 2016-08-19 MED ORDER — SODIUM CHLORIDE 0.9 % IJ SOLN
INTRAMUSCULAR | Status: AC
  Filled 2016-08-19: qty 50

## 2016-08-19 MED ORDER — LACTATED RINGERS IV SOLN
INTRAVENOUS | Status: DC
  Administered 2016-08-19 – 2016-08-20 (×3): via INTRAVENOUS

## 2016-08-19 MED ORDER — SCOPOLAMINE 1 MG/3DAYS TD PT72
1.0000 | MEDICATED_PATCH | Freq: Once | TRANSDERMAL | Status: DC
  Administered 2016-08-19: 1.5 mg via TRANSDERMAL

## 2016-08-19 MED ORDER — FENTANYL CITRATE (PF) 100 MCG/2ML IJ SOLN
INTRAMUSCULAR | Status: DC | PRN
  Administered 2016-08-19 (×5): 50 ug via INTRAVENOUS

## 2016-08-19 MED ORDER — PROPOFOL 10 MG/ML IV BOLUS
INTRAVENOUS | Status: AC
  Filled 2016-08-19: qty 20

## 2016-08-19 MED ORDER — KETOROLAC TROMETHAMINE 30 MG/ML IJ SOLN
INTRAMUSCULAR | Status: AC
  Filled 2016-08-19: qty 1

## 2016-08-19 MED ORDER — KETOROLAC TROMETHAMINE 30 MG/ML IJ SOLN: 30.0000 mg | Freq: Four times a day (QID) | INTRAMUSCULAR | Status: DC

## 2016-08-19 MED ORDER — LIDOCAINE HCL (CARDIAC) 20 MG/ML IV SOLN
INTRAVENOUS | Status: AC
  Filled 2016-08-19: qty 5

## 2016-08-19 MED ORDER — PROPOFOL 10 MG/ML IV BOLUS
INTRAVENOUS | Status: DC | PRN
  Administered 2016-08-19: 180 mg via INTRAVENOUS

## 2016-08-19 MED ORDER — PROMETHAZINE HCL 25 MG/ML IJ SOLN: 6.2500 mg | INTRAMUSCULAR | Status: DC | PRN

## 2016-08-19 MED ORDER — MENTHOL 3 MG MT LOZG: 1.0000 | LOZENGE | OROMUCOSAL | Status: DC | PRN

## 2016-08-19 MED ORDER — SENNA 8.6 MG PO TABS
1.0000 | ORAL_TABLET | Freq: Two times a day (BID) | ORAL | Status: DC
  Administered 2016-08-19: 8.6 mg via ORAL
  Filled 2016-08-19 (×5): qty 1

## 2016-08-19 MED ORDER — ALUM & MAG HYDROXIDE-SIMETH 200-200-20 MG/5ML PO SUSP
30.0000 mL | ORAL | Status: DC | PRN
  Filled 2016-08-19: qty 30

## 2016-08-19 MED ORDER — DEXAMETHASONE SODIUM PHOSPHATE 10 MG/ML IJ SOLN
INTRAMUSCULAR | Status: DC | PRN
  Administered 2016-08-19: 4 mg via INTRAVENOUS

## 2016-08-19 MED ORDER — SCOPOLAMINE 1 MG/3DAYS TD PT72
MEDICATED_PATCH | TRANSDERMAL | Status: AC
  Filled 2016-08-19: qty 1

## 2016-08-19 MED ORDER — DEXAMETHASONE SODIUM PHOSPHATE 4 MG/ML IJ SOLN
INTRAMUSCULAR | Status: AC
  Filled 2016-08-19: qty 1

## 2016-08-19 MED ORDER — FENTANYL CITRATE (PF) 100 MCG/2ML IJ SOLN
25.0000 ug | INTRAMUSCULAR | Status: DC | PRN
  Administered 2016-08-19 (×3): 50 ug via INTRAVENOUS

## 2016-08-19 MED ORDER — FLUMAZENIL 0.5 MG/5ML IV SOLN
INTRAVENOUS | Status: DC | PRN
  Administered 2016-08-19 (×2): .1 mg via INTRAVENOUS

## 2016-08-19 MED ORDER — STERILE WATER FOR IRRIGATION IR SOLN
Status: DC | PRN
Start: 2016-08-19 — End: 2016-08-19
  Administered 2016-08-19: 1000 mL

## 2016-08-19 MED ORDER — KETOROLAC TROMETHAMINE 30 MG/ML IJ SOLN
30.0000 mg | Freq: Four times a day (QID) | INTRAMUSCULAR | Status: DC
  Administered 2016-08-19 – 2016-08-20 (×3): 30 mg via INTRAVENOUS
  Filled 2016-08-19 (×3): qty 1

## 2016-08-19 MED ORDER — FENTANYL CITRATE (PF) 250 MCG/5ML IJ SOLN
INTRAMUSCULAR | Status: AC
  Filled 2016-08-19: qty 5

## 2016-08-19 MED ORDER — SUGAMMADEX SODIUM 500 MG/5ML IV SOLN
INTRAVENOUS | Status: AC
  Filled 2016-08-19: qty 10

## 2016-08-19 MED ORDER — ONDANSETRON HCL 4 MG/2ML IJ SOLN
INTRAMUSCULAR | Status: AC
  Filled 2016-08-19: qty 2

## 2016-08-19 MED ORDER — ROPIVACAINE HCL 5 MG/ML IJ SOLN
INTRAMUSCULAR | Status: AC
  Filled 2016-08-19: qty 60

## 2016-08-19 MED ORDER — FENTANYL CITRATE (PF) 100 MCG/2ML IJ SOLN
INTRAMUSCULAR | Status: AC
  Administered 2016-08-19: 50 ug via INTRAVENOUS
  Filled 2016-08-19: qty 2

## 2016-08-19 MED ORDER — LACTATED RINGERS IV SOLN
INTRAVENOUS | Status: DC
  Administered 2016-08-19 (×2): via INTRAVENOUS

## 2016-08-19 SURGICAL SUPPLY — 52 items
APPLICATOR ARISTA FLEXITIP XL (MISCELLANEOUS) ×3 IMPLANT
BARRIER ADHS 3X4 INTERCEED (GAUZE/BANDAGES/DRESSINGS) IMPLANT
CATH FOLEY 3WAY  5CC 16FR (CATHETERS) ×2
CATH FOLEY 3WAY 5CC 16FR (CATHETERS) ×1 IMPLANT
CONT PATH 16OZ SNAP LID 3702 (MISCELLANEOUS) ×3 IMPLANT
COVER BACK TABLE 60X90IN (DRAPES) ×6 IMPLANT
COVER TIP SHEARS 8 DVNC (MISCELLANEOUS) ×1 IMPLANT
COVER TIP SHEARS 8MM DA VINCI (MISCELLANEOUS) ×2
DECANTER SPIKE VIAL GLASS SM (MISCELLANEOUS) IMPLANT
DILATOR CANAL MILEX (MISCELLANEOUS) ×3 IMPLANT
DURAPREP 26ML APPLICATOR (WOUND CARE) ×3 IMPLANT
ELECT REM PT RETURN 9FT ADLT (ELECTROSURGICAL) ×3
ELECTRODE REM PT RTRN 9FT ADLT (ELECTROSURGICAL) ×1 IMPLANT
GAUZE VASELINE 3X9 (GAUZE/BANDAGES/DRESSINGS) IMPLANT
GLOVE BIO SURGEON STRL SZ7 (GLOVE) IMPLANT
GLOVE BIOGEL M 6.5 STRL (GLOVE) ×12 IMPLANT
GLOVE BIOGEL PI IND STRL 6.5 (GLOVE) IMPLANT
GLOVE BIOGEL PI IND STRL 7.0 (GLOVE) ×5 IMPLANT
GLOVE BIOGEL PI INDICATOR 6.5 (GLOVE)
GLOVE BIOGEL PI INDICATOR 7.0 (GLOVE) ×10
HEMOSTAT ARISTA ABSORB 3G PWDR (MISCELLANEOUS) ×3 IMPLANT
KIT ACCESSORY DA VINCI DISP (KITS) ×2
KIT ACCESSORY DVNC DISP (KITS) ×1 IMPLANT
LEGGING LITHOTOMY PAIR STRL (DRAPES) ×3 IMPLANT
LIQUID BAND (GAUZE/BANDAGES/DRESSINGS) ×3 IMPLANT
OCCLUDER COLPOPNEUMO (BALLOONS) ×3 IMPLANT
PACK ROBOT WH (CUSTOM PROCEDURE TRAY) ×3 IMPLANT
PACK ROBOTIC GOWN (GOWN DISPOSABLE) ×3 IMPLANT
PAD PREP 24X48 CUFFED NSTRL (MISCELLANEOUS) ×3 IMPLANT
PAD TRENDELENBURG POSITION (MISCELLANEOUS) ×3 IMPLANT
SET CYSTO W/LG BORE CLAMP LF (SET/KITS/TRAYS/PACK) ×6 IMPLANT
SET IRRIG TUBING LAPAROSCOPIC (IRRIGATION / IRRIGATOR) ×3 IMPLANT
SET TRI-LUMEN FLTR TB AIRSEAL (TUBING) IMPLANT
SUT VIC AB 0 CT1 27 (SUTURE) ×4
SUT VIC AB 0 CT1 27XBRD ANBCTR (SUTURE) ×2 IMPLANT
SUT VICRYL 0 27 CT2 27 ABS (SUTURE) ×9 IMPLANT
SUT VICRYL 0 UR6 27IN ABS (SUTURE) ×3 IMPLANT
SUT VICRYL RAPIDE 4/0 PS 2 (SUTURE) ×9 IMPLANT
SUT VLOC 180 0 9IN  GS21 (SUTURE) ×4
SUT VLOC 180 0 9IN GS21 (SUTURE) ×2 IMPLANT
SYR 50ML LL SCALE MARK (SYRINGE) ×3 IMPLANT
TIP RUMI ORANGE 6.7MMX12CM (TIP) IMPLANT
TIP UTERINE 5.1X6CM LAV DISP (MISCELLANEOUS) IMPLANT
TIP UTERINE 6.7X10CM GRN DISP (MISCELLANEOUS) IMPLANT
TIP UTERINE 6.7X6CM WHT DISP (MISCELLANEOUS) IMPLANT
TIP UTERINE 6.7X8CM BLUE DISP (MISCELLANEOUS) ×3 IMPLANT
TOWEL OR 17X24 6PK STRL BLUE (TOWEL DISPOSABLE) ×9 IMPLANT
TROCAR 12M 150ML BLUNT (TROCAR) ×3 IMPLANT
TROCAR DISP BLADELESS 8 DVNC (TROCAR) ×1 IMPLANT
TROCAR DISP BLADELESS 8MM (TROCAR) ×2
TROCAR PORT AIRSEAL 8X100 (TROCAR) ×3 IMPLANT
WATER STERILE IRR 1000ML POUR (IV SOLUTION) ×3 IMPLANT

## 2016-08-19 NOTE — H&P (Signed)
Date of Initial H&P: 08/18/2016  History reviewed, patient examined, no change in status, stable for surgery.

## 2016-08-19 NOTE — Addendum Note (Signed)
Addendum  created 08/19/16 2150 by Flossie Dibble, CRNA   Sign clinical note

## 2016-08-19 NOTE — Anesthesia Postprocedure Evaluation (Signed)
Anesthesia Post Note  Patient: Nicole Booth  Procedure(s) Performed: Procedure(s) (LRB): ROBOTIC ASSISTED TOTAL HYSTERECTOMY WITH SALPINGECTOMY (Bilateral)  Patient location during evaluation: [facility] Anesthesia Type: General Level of consciousness: awake and alert Pain management: satisfactory to patient Vital Signs Assessment: post-procedure vital signs reviewed and stable Respiratory status: spontaneous breathing and respiratory function stable Cardiovascular status: stable Postop Assessment: adequate PO intake Anesthetic complications: no     Last Vitals:  Vitals:   08/19/16 1430 08/19/16 1700  BP: 104/70 111/71  Pulse: 68 68  Resp: 16 16  Temp: 36.8 C 37.1 C    Last Pain:  Vitals:   08/19/16 2025  TempSrc:   PainSc: 6    Pain Goal: Patients Stated Pain Goal: 3 (08/19/16 2025)               [name]

## 2016-08-19 NOTE — Transfer of Care (Signed)
Immediate Anesthesia Transfer of Care Note  Patient: Nicole Booth  Procedure(s) Performed: Procedure(s): ROBOTIC ASSISTED TOTAL HYSTERECTOMY WITH SALPINGECTOMY (Bilateral)  Patient Location: PACU  Anesthesia Type:General  Level of Consciousness: awake, sedated and patient cooperative  Airway & Oxygen Therapy: Patient Spontanous Breathing and Patient connected to nasal cannula oxygen  Post-op Assessment: Report given to RN and Post -op Vital signs reviewed and stable  Post vital signs: Reviewed and stable  Last Vitals:  Vitals:   08/19/16 0745  BP: 102/78  Pulse: 67  Resp: 18  Temp: 36.9 C    Last Pain:  Vitals:   08/19/16 0745  TempSrc: Oral      Patients Stated Pain Goal: 3 ([id number])  Complications: No apparent anesthesia complications

## 2016-08-19 NOTE — Anesthesia Postprocedure Evaluation (Signed)
Anesthesia Post Note  Patient: Nicole Booth  Procedure(s) Performed: Procedure(s) (LRB): ROBOTIC ASSISTED TOTAL HYSTERECTOMY WITH SALPINGECTOMY (Bilateral)  Patient location during evaluation: PACU Anesthesia Type: General Level of consciousness: awake and alert Pain management: pain level controlled Vital Signs Assessment: post-procedure vital signs reviewed and stable Respiratory status: spontaneous breathing, nonlabored ventilation, respiratory function stable and patient connected to nasal cannula oxygen Cardiovascular status: blood pressure returned to baseline and stable Postop Assessment: no signs of nausea or vomiting Anesthetic complications: no     Last Vitals:  Vitals:   08/19/16 1145 08/19/16 1200  BP: 121/87 110/76  Pulse: 73 61  Resp: 14 10  Temp:      Last Pain:  Vitals:   08/19/16 1200  TempSrc:   PainSc: Asleep   Pain Goal: Patients Stated Pain Goal: 3 (08/19/16 1145)               Catalina Gravel

## 2016-08-19 NOTE — Anesthesia Procedure Notes (Signed)
Procedure Name: Intubation Date/Time: 08/19/2016 8:36 AM Performed by: Tobin Chad Pre-anesthesia Checklist: Patient identified, Suction available, Patient being monitored, Emergency Drugs available and Timeout performed Patient Re-evaluated:Patient Re-evaluated prior to inductionOxygen Delivery Method: Circle system utilized and Simple face mask Preoxygenation: Pre-oxygenation with 100% oxygen Intubation Type: IV induction and Inhalational induction Ventilation: Mask ventilation without difficulty Laryngoscope Size: Mac and 3 Grade View: Grade II Tube type: Oral Tube size: 7.0 mm Number of attempts: 1 Placement Confirmation: ETT inserted through vocal cords under direct vision,  positive ETCO2 and breath sounds checked- equal and bilateral Secured at: 22 cm Tube secured with: Tape Dental Injury: Teeth and Oropharynx as per pre-operative assessment

## 2016-08-19 NOTE — Op Note (Signed)
08/19/2016  11:12 AM  PATIENT:  Nicole Booth  40 y.o. female  PRE-OPERATIVE DIAGNOSIS:   Fibroids  Menorrhagia Anemia  POST-OPERATIVE DIAGNOSIS:   Fibroids Menorrhagia Anemia  PROCEDURE:  Procedure(s): ROBOTIC ASSISTED TOTAL HYSTERECTOMY WITH SALPINGECTOMY (Bilateral)  SURGEON:  Surgeon(s) and Role:    * Christophe Louis, MD - Primary    * Janyth Pupa, DO - Assisting  PHYSICIAN ASSISTANT: None ASSISTANTS: Dr. Janyth Pupa assisted due to concern for adhesive disease and the complexity of the surgery    ANESTHESIA:   general  EBL:  Total I/O In: 1200 [I.V.:1200] Out: 250 [Urine:200; Blood:50]  BLOOD ADMINISTERED:none  DRAINS: Urinary Catheter (Foley)   LOCAL MEDICATIONS USED:  OTHER ropivicaine   SPECIMEN:  Source of Specimen:  uterus cervix and bilateral fallopian tubes   DISPOSITION OF SPECIMEN:  PATHOLOGY  COUNTS:  YES  TOURNIQUET:  * No tourniquets in log *  DICTATION: .Dragon Dictation  PLAN OF CARE: Admit for overnight observation  PATIENT DISPOSITION:  PACU - hemodynamically stable.   Delay start of Pharmacological VTE agent (>24hrs) due to surgical blood loss or risk of bleeding: not applicable  Findings: Enlarged fibroid uterus. Normal fallopian tubes and ovaries. Adhesion of omentum to the anterior abdominal wall.   Procedure: The  patient was taken to the operating room where she was placed under general anesthesia. She was placed in dorsal lithotomy position and prepped and draped in the usual sterile fashion. A weighted speculum was placed into the vagina.  A Deaver was placed anteriorly for retraction. The anterior lip of the cervix was grasped with a single-tooth tenaculum. The vaginal mucosa was injected with 2.5 cc of ropivacaine at the 2/4/ 8 and 10 oclock  positions. The uterus was sounded to 9 cm the cervix was dilated to 6 mm . 0 vicryl sutrure  placed at the 12 and 6:00 positions  Of the cervix to facilitate placement of a Rumi  uterine   manipulator. The manipulator was placed without difficulty. Weighted speculum and Deaver were removed .  Attention was turned to the patient's abdomen where a  12 mm skin incision was made 4 cm above the umbilicus..  A 12 mm trocar was placed under direct visualization . The pneumoperitoneum  was achieved with CO2 gas.  The laparoscope was removed. 60 cc of ropivacaine were injected into the abdominal cavity. The laparoscope  was reinserted. An  80mm incision was made in the right upper quadrant and an 8 mm   trocar was placed 10 centimeters from the umbilicus.later connected to robotic arm #1). An 8MM incision was made in there  left upper quadrant TROCAR WAS PLACED 16 cm from the umbilicus. Later connected to robotic arm #2.  Attention was turned to the left  upper quadrant where a 8 mm midclavicular assistant  trocar was placed. ( All incision sites were injected with 10cc of ropivicaine prior to port placement. )  Once all ports had been placed under direct visualization.The laparoscope was removed and the [employer] robotic system was then right-side docked.  The robotic  arms were connected to the corresponding trocars as listed above. The laparoscope  was then reinserted.  The PK bipolar cautery was placed into port #1. The monopolar scissor placed in the port #2. All instruments were directed into the pelvis under direct visualization.  Attention  was turned to the surgeons console.. The left mesosalpinx and let  utero-ovarian ligament was cauterized with PK and excised with scissors. The broad ligament  was cauterized with PK incised with scissors. The round ligament was cauterized with the PK incised with scissors. The anterior leaf of broad ligament was incised along the bladder reflection to the midline.  The right mesosalpinx and right  utero-ovarian ligament was cauterized with PK and excised with scissors. The right broad ligament was cauterized with PK excised scissors. The right round ligament  was cauterized with PK and excised with scissors. The   broad ligament was incised  to the midline. The bladder was filled with sterile water stained with methylene blue.  The bladder was dissected off the lower uterine segments of the cervix via sharp and blunt dissection.  The uterine arteries were skeletonized bilaterally. They were cauterized with PK and transected. The KOH ring  was identified.  The anterior colpotomy was performed followed by the posterior colpotomy. Once the uterus and cervix were completely excised They were removed through the vagina.   The pk and scissors were removed and log tip forceps were   placed in the port #1 and the cutting needle driver was placed in to port #2. The vaginal cuff angles were closed with  0  V-lock in a running fashion. The pelvis was irrigated. . Slight oozing was noted from the vaginal cuff.Loletta Parish was placed along the vaginal cuff.. Excellent hemostasis was noted.  All pelvic pedicles were examined and hemostasis was noted.  All instuments  removed from the ports.  All ports were removed under direct  Visualization. The  pneumoperitoneum was released.  The fascia of the 12 mm umbilical port was closed with 0 Vicryl The skin incisions were closed with 4-0 Vicryl and then covered with Dermabond.     Sponge lap and needle counts were correct x2.  The patient was awakened from anesthesia and taken to the recovery room in stable condition.

## 2016-08-20 ENCOUNTER — Encounter (HOSPITAL_COMMUNITY): Payer: Self-pay | Admitting: Obstetrics and Gynecology

## 2016-08-20 DIAGNOSIS — Z90710 Acquired absence of both cervix and uterus: Secondary | ICD-10-CM | POA: Diagnosis not present

## 2016-08-20 LAB — CBC
HCT: 31 % — ABNORMAL LOW (ref 36.0–46.0)
Hemoglobin: 10 g/dL — ABNORMAL LOW (ref 12.0–15.0)
MCH: 25.8 pg — ABNORMAL LOW (ref 26.0–34.0)
MCHC: 32.3 g/dL (ref 30.0–36.0)
MCV: 80.1 fL (ref 78.0–100.0)
Platelets: 247 10*3/uL (ref 150–400)
RBC: 3.87 MIL/uL (ref 3.87–5.11)
RDW: 14 % (ref 11.5–15.5)
WBC: 9.4 10*3/uL (ref 4.0–10.5)

## 2016-08-20 MED ORDER — IBUPROFEN 800 MG PO TABS: 800.0000 mg | ORAL_TABLET | Freq: Three times a day (TID) | ORAL | 1 refills | Status: AC | PRN

## 2016-08-20 MED ORDER — OXYCODONE-ACETAMINOPHEN 5-325 MG PO TABS: 1.0000 | ORAL_TABLET | ORAL | 0 refills | Status: DC | PRN

## 2016-08-20 NOTE — Progress Notes (Signed)
1 Day Post-Op Procedure(s) (LRB): ROBOTIC ASSISTED TOTAL HYSTERECTOMY WITH SALPINGECTOMY (Bilateral)  Subjective: Patient reports tolerating PO.  No void yet folley has only been out for 30 minutes per patient. She has pain in her back that is worse with walking. Pain 5 out 10 when moving around. No pain when lying in bed   Objective: I have reviewed patient's vital signs, intake and output and labs.  General: alert, cooperative and no distress Resp: clear to auscultation bilaterally Cardio: regular rate and rhythm GI: soft appropriately tender nondistended +BS in all 4 quadrants Extremities: extremities normal, atraumatic, no cyanosis or edema  Assessment: s/p Procedure(s): ROBOTIC ASSISTED TOTAL HYSTERECTOMY WITH SALPINGECTOMY (Bilateral): stable, progressing well and tolerating diet  Plan: Encourage ambulation  Awaiting void  Once pt voids plan discharge home today with follow up in the office 2-3 wks   LOS: 0 days    Leshea Jaggers J. 08/20/2016, 7:16 AM

## 2016-08-20 NOTE — Progress Notes (Signed)
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

## 2016-08-20 NOTE — Discharge Summary (Signed)
Physician Discharge Summary  Patient ID: Nicole Booth MRN: [id number] DOB/AGE: [birth year] 40 y.o.  Admit date: 08/19/2016 Discharge date: 08/20/2016  Admission Diagnoses: 1) menorrhagia  With regular cycle 2) Fibroids 3) anemia   Discharge Diagnoses:  Active Problems:   Fibroids, intramural   Menorrhagia with regular cycle   S/P laparoscopic hysterectomy   Discharged Condition: stable  Hospital Course: pt was admitted for observation after undergoing robotic assisted laparoscopic hysterectomy with bilateral salpingectomy. She did well postoperatively with return of bowel and bladder function.   Consults: None  Significant Diagnostic Studies: labs: Hgb 10 on post op day 1   Treatments: surgery: robotic assisted laparoscopic hysterectomy with bilateral salpingectomy    Disposition:   Discharge Instructions    Call MD for:  persistant nausea and vomiting    Complete by:  As directed    Call MD for:  redness, tenderness, or signs of infection (pain, swelling, redness, odor or green/yellow discharge around incision site)    Complete by:  As directed    Call MD for:  severe uncontrolled pain    Complete by:  As directed    Call MD for:  temperature >100.4    Complete by:  As directed    Diet - low sodium heart healthy    Complete by:  As directed    Driving Restrictions    Complete by:  As directed    Avoid driving for 1 week   Increase activity slowly    Complete by:  As directed    Lifting restrictions    Complete by:  As directed    Avoid lifting over 10 lbs   Sexual Activity Restrictions    Complete by:  As directed    Avoid sex for 6 weeks       Medication List    STOP taking these medications   acetaminophen 500 MG tablet Commonly known as:  TYLENOL     TAKE these medications   folic acid 1 MG tablet Commonly known as:  FOLVITE Take 1 tablet (1 mg total) by mouth daily.   hyoscyamine 0.125 MG Tbdp disintergrating tablet Commonly known as:   ANASPAZ Place 0.125 mg under the tongue every 4 (four) hours as needed for bladder spasms.   hyoscyamine 0.375 MG 12 hr tablet Commonly known as:  LEVBID Take 0.375 mg by mouth at bedtime.   ibuprofen 800 MG tablet Commonly known as:  ADVIL,MOTRIN Take 1 tablet (800 mg total) by mouth every 8 (eight) hours as needed (mild pain).   oxyCODONE-acetaminophen 5-325 MG tablet Commonly known as:  PERCOCET/ROXICET Take 1-2 tablets by mouth every 4 (four) hours as needed for severe pain (moderate to severe pain (when tolerating fluids)).   pantoprazole 40 MG tablet Commonly known as:  PROTONIX Take 40 mg by mouth daily.      Follow-up Information    Kavan Devan J., MD Follow up in 3 week(s).   Specialty:  Obstetrics and Gynecology Why:  patient already has an appointment for postoperative visit  Contact information: [address] [place] [address] [phone]           Signed: Catha Brow. 08/20/2016, 7:26 AM

## 2016-08-21 ENCOUNTER — Other Ambulatory Visit: Payer: Self-pay | Admitting: Internal Medicine

## 2016-08-21 DIAGNOSIS — Z1231 Encounter for screening mammogram for malignant neoplasm of breast: Secondary | ICD-10-CM

## 2016-09-04 ENCOUNTER — Ambulatory Visit
Admission: RE | Admit: 2016-09-04 | Discharge: 2016-09-04 | Disposition: A | Discharge status: Home / Self Care | Payer: Federal, State, Local not specified - PPO | Source: Ambulatory Visit | Attending: Internal Medicine | Admitting: Internal Medicine

## 2016-09-04 DIAGNOSIS — Z1231 Encounter for screening mammogram for malignant neoplasm of breast: Secondary | ICD-10-CM

## 2016-12-14 ENCOUNTER — Other Ambulatory Visit: Payer: Self-pay | Admitting: *Deleted

## 2016-12-14 DIAGNOSIS — K909 Intestinal malabsorption, unspecified: Secondary | ICD-10-CM

## 2016-12-15 ENCOUNTER — Other Ambulatory Visit (HOSPITAL_BASED_OUTPATIENT_CLINIC_OR_DEPARTMENT_OTHER): Payer: Federal, State, Local not specified - PPO

## 2016-12-15 DIAGNOSIS — D509 Iron deficiency anemia, unspecified: Secondary | ICD-10-CM

## 2016-12-15 DIAGNOSIS — K909 Intestinal malabsorption, unspecified: Secondary | ICD-10-CM

## 2016-12-15 LAB — COMPREHENSIVE METABOLIC PANEL
ALT: <6 U/L (ref 0–55)
AST: 15 U/L (ref 5–34)
Albumin: 3.7 g/dL (ref 3.5–5.0)
Alkaline Phosphatase: 66 U/L (ref 40–150)
Anion Gap: 8 mEq/L (ref 3–11)
BUN: 11.1 mg/dL (ref 7.0–26.0)
CO2: 26 mEq/L (ref 22–29)
Calcium: 9.1 mg/dL (ref 8.4–10.4)
Chloride: 104 mEq/L (ref 98–109)
Creatinine: 0.9 mg/dL (ref 0.6–1.1)
EGFR: >90 mL/min/{1.73_m2} (ref >90)
Glucose: 92 mg/dl (ref 70–140)
Potassium: 4.2 mEq/L (ref 3.5–5.1)
Sodium: 137 mEq/L (ref 136–145)
Total Bilirubin: 0.45 mg/dL (ref 0.20–1.20)
Total Protein: 7.3 g/dL (ref 6.4–8.3)

## 2016-12-15 LAB — CBC WITH DIFFERENTIAL (CANCER CENTER ONLY)
BASO#: 0 10*3/uL (ref 0.0–0.2)
BASO%: 0.4 % (ref 0.0–2.0)
EOS%: 2.9 % (ref 0.0–7.0)
Eosinophils Absolute: 0.2 10*3/uL (ref 0.0–0.5)
HCT: 38 % (ref 34.8–46.6)
HGB: 12.1 g/dL (ref 11.6–15.9)
LYMPH#: 1.2 10*3/uL (ref 0.9–3.3)
LYMPH%: 22.4 % (ref 14.0–48.0)
MCH: 25.2 pg — ABNORMAL LOW (ref 26.0–34.0)
MCHC: 31.8 g/dL — ABNORMAL LOW (ref 32.0–36.0)
MCV: 79 fL — ABNORMAL LOW (ref 81–101)
MONO#: 0.4 10*3/uL (ref 0.1–0.9)
MONO%: 8.6 % (ref 0.0–13.0)
NEUT#: 3.4 10*3/uL (ref 1.5–6.5)
NEUT%: 65.7 % (ref 39.6–80.0)
Platelets: 269 10*3/uL (ref 145–400)
RBC: 4.81 10*6/uL (ref 3.70–5.32)
RDW: 14.9 % (ref 11.1–15.7)
WBC: 5.1 10*3/uL (ref 3.9–10.0)

## 2016-12-15 LAB — IRON AND TIBC
%SAT: 8 % — ABNORMAL LOW (ref 21–57)
Iron: 35 ug/dL — ABNORMAL LOW (ref 41–142)
TIBC: 450 ug/dL — ABNORMAL HIGH (ref 236–444)
UIBC: 415 ug/dL — ABNORMAL HIGH (ref 120–384)

## 2016-12-15 LAB — FERRITIN: Ferritin: 11 ng/ml (ref 9–269)

## 2016-12-16 ENCOUNTER — Other Ambulatory Visit: Payer: Self-pay | Admitting: Family

## 2016-12-16 LAB — RETICULOCYTES: Reticulocyte Count: 1.3 % (ref 0.6–2.6)

## 2016-12-18 ENCOUNTER — Other Ambulatory Visit: Payer: Self-pay | Admitting: Family

## 2016-12-18 ENCOUNTER — Ambulatory Visit (HOSPITAL_BASED_OUTPATIENT_CLINIC_OR_DEPARTMENT_OTHER): Payer: Federal, State, Local not specified - PPO

## 2016-12-18 ENCOUNTER — Ambulatory Visit (HOSPITAL_BASED_OUTPATIENT_CLINIC_OR_DEPARTMENT_OTHER): Payer: Federal, State, Local not specified - PPO | Admitting: Hematology & Oncology

## 2016-12-18 VITALS — BP 120/63 | HR 71 | Temp 98.3°F | Resp 18 | Wt 211.0 lb

## 2016-12-18 DIAGNOSIS — D509 Iron deficiency anemia, unspecified: Secondary | ICD-10-CM

## 2016-12-18 DIAGNOSIS — D508 Other iron deficiency anemias: Secondary | ICD-10-CM

## 2016-12-18 DIAGNOSIS — K909 Intestinal malabsorption, unspecified: Secondary | ICD-10-CM | POA: Diagnosis not present

## 2016-12-18 DIAGNOSIS — D500 Iron deficiency anemia secondary to blood loss (chronic): Secondary | ICD-10-CM

## 2016-12-18 DIAGNOSIS — R635 Abnormal weight gain: Secondary | ICD-10-CM

## 2016-12-18 LAB — TSH: TSH: 1.939 m(IU)/L (ref 0.308–3.960)

## 2016-12-18 MED ORDER — SODIUM CHLORIDE 0.9 % IV SOLN
510.0000 mg | Freq: Once | INTRAVENOUS | Status: AC
  Administered 2016-12-18: 510 mg via INTRAVENOUS
  Filled 2016-12-18: qty 17

## 2016-12-18 MED ORDER — DIPHENHYDRAMINE HCL 25 MG PO CAPS
ORAL_CAPSULE | ORAL | Status: AC
  Filled 2016-12-18: qty 1

## 2016-12-18 MED ORDER — SODIUM CHLORIDE 0.9 % IV SOLN
Freq: Once | INTRAVENOUS | Status: AC
  Administered 2016-12-18: 15:00:00 via INTRAVENOUS

## 2016-12-18 MED ORDER — FAMOTIDINE IN NACL 20-0.9 MG/50ML-% IV SOLN
20.0000 mg | Freq: Once | INTRAVENOUS | Status: AC
  Administered 2016-12-18: 20 mg via INTRAVENOUS

## 2016-12-18 MED ORDER — DIPHENHYDRAMINE HCL 25 MG PO CAPS
25.0000 mg | ORAL_CAPSULE | Freq: Once | ORAL | Status: AC
  Administered 2016-12-18: 25 mg via ORAL

## 2016-12-18 MED ORDER — FAMOTIDINE IN NACL 20-0.9 MG/50ML-% IV SOLN
INTRAVENOUS | Status: AC
  Filled 2016-12-18: qty 50

## 2016-12-18 NOTE — Progress Notes (Signed)
Hematology and Oncology Follow Up Visit  Nicole Booth [id number] 04-13-1976 41 y.o. 12/18/2016   Principle Diagnosis:   Iron deficiency anemia secondary to menometrorrhagia  Current Therapy:    Status post hysterectomy  IV iron as indicated-doses to be given on 12/19/2015 and next week     Interim History:  Nicole Booth is back for a long-awaited follow-up. I think we last saw her a year ago. Since then, she has had her hysterectomy. She had this back in September. She also had arthroscopic surgery on her left knee. She is to have another surgery on her right knee. She is not sure when this will be.  She had lab studies done a couple days ago. She is still iron deficient. Her ferritin was only 11. Her iron saturation was 8%.   She still feels tired. She does not have a lot of energy. She has not had her thyroid tested. She did gain weight. She's not be of exercise, mostly because of her surgery.  She's had no bleeding. She's had some constipation.  She is chewing ice again.  Overall, her performance status is ECOG 1.  Medications:  Current Outpatient Prescriptions:  .  hyoscyamine (ANASPAZ) 0.125 MG TBDP disintergrating tablet, Place 0.125 mg under the tongue every 4 (four) hours as needed for bladder spasms. , Disp: , Rfl: 6 .  hyoscyamine (LEVBID) 0.375 MG 12 hr tablet, Take 0.375 mg by mouth at bedtime., Disp: , Rfl: 3 .  ibuprofen (ADVIL,MOTRIN) 800 MG tablet, Take 1 tablet (800 mg total) by mouth every 8 (eight) hours as needed (mild pain)., Disp: 30 tablet, Rfl: 1 .  oxyCODONE-acetaminophen (PERCOCET/ROXICET) 5-325 MG tablet, Take 1-2 tablets by mouth every 4 (four) hours as needed for severe pain (moderate to severe pain (when tolerating fluids))., Disp: 40 tablet, Rfl: 0 .  pantoprazole (PROTONIX) 40 MG tablet, Take 40 mg by mouth daily., Disp: , Rfl:   Allergies:  Allergies  Allergen Reactions  . Penicillins Hives and Rash    Has patient had a PCN reaction causing  immediate rash, facial/tongue/throat swelling, SOB or lightheadedness with hypotension: Yes Has patient had a PCN reaction causing severe rash involving mucus membranes or skin necrosis: No Has patient had a PCN reaction that required hospitalization No Has patient had a PCN reaction occurring within the last 10 years: No If all of the above answers are "NO", then may proceed with Cephalosporin use.   . Dairy Aid [Lactase] Other (See Comments)    diarhea  . Shellfish-Derived Products Hives    Itching    Past Medical History, Surgical history, Social history, and Family History were reviewed and updated.  Review of Systems:  As above  Physical Exam:  weight is 211 lb (95.7 kg). Her oral temperature is 98.3 F (36.8 C). Her blood pressure is 120/63 and her pulse is 71. Her respiration is 18 and oxygen saturation is 99%.   Wt Readings from Last 3 Encounters:  12/18/16 211 lb (95.7 kg)  08/19/16 200 lb 6 oz (90.9 kg)  08/07/16 200 lb 6 oz (90.9 kg)      Obese African-American female in no obvious distress. Head exam shows no ocular or oral lesions. There are no palpable cervical or supraclavicular nodes. Lungs are clear bilaterally. Cardiac exam regular rate and rhythm with no murmurs, rubs or bruits. Abdomen is soft. She is obese. She has decent bowel sounds. There is no fluid wave. There is no palpable liver or spleen tip. Externally shows  no clubbing, cyanosis or edema. She's good range motion of her joints. Skin exam shows no rashes, ecchymoses or petechia. Her skin might be somewhat dry. Neurological exam shows no focal neurological deficits.  Lab Results  Component Value Date   WBC 5.1 12/15/2016   HGB 12.1 12/15/2016   HCT 38.0 12/15/2016   MCV 79 (L) 12/15/2016   PLT 269 12/15/2016     Chemistry      Component Value Date/Time   NA 137 12/15/2016 1050   K 4.2 12/15/2016 1050   CL 106 08/07/2016 1550   CO2 26 12/15/2016 1050   BUN 11.1 12/15/2016 1050   CREATININE 0.9  12/15/2016 1050      Component Value Date/Time   CALCIUM 9.1 12/15/2016 1050   ALKPHOS 66 12/15/2016 1050   AST 15 12/15/2016 1050   ALT <6 12/15/2016 1050   BILITOT 0.45 12/15/2016 1050         Impression and Plan: Nicole Booth is A 41 year old African American female. She is still iron deficient.  I looked at her blood smear. She does have some hypochromic and microcytic red blood cells.  It is not surprising that she is still iron deficient. We have not given her iron for quite a while. We have not seen her for over a year. Even though she had a hysterectomy, she probably was iron deficient. Then she had the knee surgery.  We will go ahead and give her IV iron today. I will then give her another dose in 1 week. This way, I can be assured that her iron stores are replaced.  I'm checking her TSH. I'm not sure when this was last checked. Some of her symptoms might be from hypothyroidism.  I will like to see her back in about 4 weeks or so. Hopefully, she will be feeling better by then.   [name], MD 1/12/20182:07 PM

## 2016-12-18 NOTE — Patient Instructions (Signed)

## 2016-12-21 ENCOUNTER — Encounter: Payer: Self-pay | Admitting: *Deleted

## 2016-12-25 ENCOUNTER — Ambulatory Visit: Payer: Federal, State, Local not specified - PPO

## 2017-01-04 DIAGNOSIS — M25562 Pain in left knee: Secondary | ICD-10-CM | POA: Diagnosis not present

## 2017-01-04 DIAGNOSIS — E669 Obesity, unspecified: Secondary | ICD-10-CM | POA: Diagnosis not present

## 2017-01-04 DIAGNOSIS — M25561 Pain in right knee: Secondary | ICD-10-CM | POA: Diagnosis not present

## 2017-01-04 DIAGNOSIS — K219 Gastro-esophageal reflux disease without esophagitis: Secondary | ICD-10-CM | POA: Diagnosis not present

## 2017-01-06 ENCOUNTER — Ambulatory Visit: Payer: Federal, State, Local not specified - PPO

## 2017-01-07 ENCOUNTER — Ambulatory Visit (HOSPITAL_BASED_OUTPATIENT_CLINIC_OR_DEPARTMENT_OTHER): Payer: Federal, State, Local not specified - PPO

## 2017-01-07 VITALS — BP 117/67 | HR 62 | Temp 97.7°F | Resp 17

## 2017-01-07 DIAGNOSIS — D509 Iron deficiency anemia, unspecified: Secondary | ICD-10-CM

## 2017-01-07 DIAGNOSIS — K909 Intestinal malabsorption, unspecified: Secondary | ICD-10-CM

## 2017-01-07 DIAGNOSIS — D500 Iron deficiency anemia secondary to blood loss (chronic): Secondary | ICD-10-CM

## 2017-01-07 MED ORDER — SODIUM CHLORIDE 0.9 % IV SOLN
510.0000 mg | Freq: Once | INTRAVENOUS | Status: AC
  Administered 2017-01-07: 510 mg via INTRAVENOUS
  Filled 2017-01-07: qty 17

## 2017-01-07 MED ORDER — FAMOTIDINE IN NACL 20-0.9 MG/50ML-% IV SOLN
INTRAVENOUS | Status: AC
  Filled 2017-01-07: qty 50

## 2017-01-07 MED ORDER — SODIUM CHLORIDE 0.9 % IV SOLN
Freq: Once | INTRAVENOUS | Status: AC
  Administered 2017-01-07: 15:00:00 via INTRAVENOUS

## 2017-01-07 MED ORDER — DIPHENHYDRAMINE HCL 25 MG PO CAPS
ORAL_CAPSULE | ORAL | Status: AC
  Filled 2017-01-07: qty 1

## 2017-01-07 MED ORDER — DIPHENHYDRAMINE HCL 25 MG PO CAPS
25.0000 mg | ORAL_CAPSULE | Freq: Once | ORAL | Status: AC
  Administered 2017-01-07: 25 mg via ORAL

## 2017-01-07 MED ORDER — FAMOTIDINE IN NACL 20-0.9 MG/50ML-% IV SOLN
20.0000 mg | Freq: Once | INTRAVENOUS | Status: AC
  Administered 2017-01-07: 20 mg via INTRAVENOUS

## 2017-01-07 NOTE — Progress Notes (Signed)
Feraheme running over 30 mins per pt request

## 2017-01-07 NOTE — Patient Instructions (Signed)

## 2017-01-12 ENCOUNTER — Telehealth: Payer: Self-pay | Admitting: *Deleted

## 2017-01-12 NOTE — Telephone Encounter (Signed)
Patient has been experiencing diarrhea since receiving her feraheme infusion. She wants to know if feraheme can cause this. She has no other symptoms.   Spoke with Dr Marin Olp and Pharm D and it's unlikely that the diarrhea is related to the feraheme. There are multiple illnesses in the community right now, and it's likely patient has become ill.  Recommended to patient to well hydrate and eat healthy and that if diarrhea doesn't improve in the next 24-48 hours to follow up with her PCP. She understood instructions.

## 2017-01-22 ENCOUNTER — Ambulatory Visit (HOSPITAL_BASED_OUTPATIENT_CLINIC_OR_DEPARTMENT_OTHER): Payer: Federal, State, Local not specified - PPO | Admitting: Hematology & Oncology

## 2017-01-22 ENCOUNTER — Other Ambulatory Visit (HOSPITAL_BASED_OUTPATIENT_CLINIC_OR_DEPARTMENT_OTHER): Payer: Federal, State, Local not specified - PPO

## 2017-01-22 VITALS — BP 127/65 | HR 70 | Temp 98.4°F | Resp 18 | Ht 63.0 in | Wt 207.1 lb

## 2017-01-22 DIAGNOSIS — R635 Abnormal weight gain: Secondary | ICD-10-CM | POA: Diagnosis not present

## 2017-01-22 DIAGNOSIS — N921 Excessive and frequent menstruation with irregular cycle: Secondary | ICD-10-CM

## 2017-01-22 DIAGNOSIS — D500 Iron deficiency anemia secondary to blood loss (chronic): Secondary | ICD-10-CM | POA: Diagnosis not present

## 2017-01-22 DIAGNOSIS — K580 Irritable bowel syndrome with diarrhea: Secondary | ICD-10-CM

## 2017-01-22 DIAGNOSIS — K909 Intestinal malabsorption, unspecified: Secondary | ICD-10-CM

## 2017-01-22 LAB — COMPREHENSIVE METABOLIC PANEL (CC13)
ALT: 5 IU/L (ref 0–32)
AST (SGOT): 11 IU/L (ref 0–40)
Albumin, Serum: 4.3 g/dL (ref 3.5–5.5)
Albumin/Globulin Ratio: 1.4 (ref 1.2–2.2)
Alkaline Phosphatase, S: 60 IU/L (ref 39–117)
BUN/Creatinine Ratio: 12 (ref 9–23)
BUN: 13 mg/dL (ref 6–24)
Bilirubin Total: 0.4 mg/dL (ref 0.0–1.2)
Calcium, Ser: 9.9 mg/dL (ref 8.7–10.2)
Carbon Dioxide, Total: 23 mmol/L (ref 18–29)
Chloride, Ser: 104 mmol/L (ref 96–106)
Creatinine, Ser: 1.07 mg/dL — ABNORMAL HIGH (ref 0.57–1.00)
GFR calc Af Amer: 75 mL/min/{1.73_m2} (ref >59)
GFR calc non Af Amer: 65 mL/min/{1.73_m2} (ref >59)
Globulin, Total: 3.1 g/dL (ref 1.5–4.5)
Glucose: 83 mg/dL (ref 65–99)
Potassium, Ser: 3.6 mmol/L (ref 3.5–5.2)
Sodium: 134 mmol/L (ref 134–144)
Total Protein: 7.4 g/dL (ref 6.0–8.5)

## 2017-01-22 LAB — CBC WITH DIFFERENTIAL (CANCER CENTER ONLY)
BASO#: 0 10*3/uL (ref 0.0–0.2)
BASO%: 0.4 % (ref 0.0–2.0)
EOS%: 3.5 % (ref 0.0–7.0)
Eosinophils Absolute: 0.2 10*3/uL (ref 0.0–0.5)
HCT: 42.7 % (ref 34.8–46.6)
HGB: 14.5 g/dL (ref 11.6–15.9)
LYMPH#: 1.3 10*3/uL (ref 0.9–3.3)
LYMPH%: 23.3 % (ref 14.0–48.0)
MCH: 27.8 pg (ref 26.0–34.0)
MCHC: 34 g/dL (ref 32.0–36.0)
MCV: 82 fL (ref 81–101)
MONO#: 0.4 10*3/uL (ref 0.1–0.9)
MONO%: 7.5 % (ref 0.0–13.0)
NEUT#: 3.6 10*3/uL (ref 1.5–6.5)
NEUT%: 65.3 % (ref 39.6–80.0)
Platelets: 228 10*3/uL (ref 145–400)
RBC: 5.21 10*6/uL (ref 3.70–5.32)
RDW: 17.6 % — ABNORMAL HIGH (ref 11.1–15.7)
WBC: 5.5 10*3/uL (ref 3.9–10.0)

## 2017-01-22 MED ORDER — HYOSCYAMINE SULFATE ER 0.375 MG PO TB12: 0.3750 mg | ORAL_TABLET | Freq: Every day | ORAL | 6 refills | Status: DC

## 2017-01-22 NOTE — Progress Notes (Signed)
Hematology and Oncology Follow Up Visit  Nicole Booth [id number] [birth year] 41 y.o. 01/22/2017   Principle Diagnosis:   Iron deficiency anemia secondary to menometrorrhagia  Current Therapy:    Status post hysterectomy IV iron as indicated-last dose given on 01/07/2017      Interim History:  Nicole Booth is back for follow-up. She is doing well. She is feeling better. She's had no bleeding. We did go ahead and give her iron back in late January early February. Her ferritin was only 11 with iron saturation of 8%.  Again she had her hysterectomy back in September. She did well with this.  She's had no problems with increasing fatigue or weakness. She is still working. She still think care of her family. Her husband comes in with her today.  She did have a nice Valentine's Day.  She is not chewing ice.   Overall, her performance status is ECOG 1.  Medications:  Current Outpatient Prescriptions:  .  hyoscyamine (ANASPAZ) 0.125 MG TBDP disintergrating tablet, Place 0.125 mg under the tongue every 4 (four) hours as needed for bladder spasms. , Disp: , Rfl: 6 .  hyoscyamine (LEVBID) 0.375 MG 12 hr tablet, Take 1 tablet (0.375 mg total) by mouth at bedtime., Disp: 60 tablet, Rfl: 6 .  ibuprofen (ADVIL,MOTRIN) 800 MG tablet, Take 1 tablet (800 mg total) by mouth every 8 (eight) hours as needed (mild pain)., Disp: 30 tablet, Rfl: 1  Allergies:  Allergies  Allergen Reactions  . Penicillins Hives and Rash    Has patient had a PCN reaction causing immediate rash, facial/tongue/throat swelling, SOB or lightheadedness with hypotension: Yes Has patient had a PCN reaction causing severe rash involving mucus membranes or skin necrosis: No Has patient had a PCN reaction that required hospitalization No Has patient had a PCN reaction occurring within the last 10 years: No If all of the above answers are "NO", then may proceed with Cephalosporin use.   . Dairy Aid [Lactase] Diarrhea  .  Shellfish-Derived Products Hives    Itching    Past Medical History, Surgical history, Social history, and Family History were reviewed and updated.  Review of Systems:  As above  Physical Exam:  height is 5\' 3"  (1.6 m) and weight is 207 lb 1.9 oz (93.9 kg). Her oral temperature is 98.4 F (36.9 C). Her blood pressure is 127/65 and her pulse is 70. Her respiration is 18 and oxygen saturation is 100%.   Wt Readings from Last 3 Encounters:  01/22/17 207 lb 1.9 oz (93.9 kg)  12/18/16 211 lb (95.7 kg)  08/19/16 200 lb 6 oz (90.9 kg)      Obese African-American female in no obvious distress. Head exam shows no ocular or oral lesions. There are no palpable cervical or supraclavicular nodes. Lungs are clear bilaterally. Cardiac exam regular rate and rhythm with no murmurs, rubs or bruits. Abdomen is soft. She is obese. She has decent bowel sounds. There is no fluid wave. There is no palpable liver or spleen tip. Externally shows no clubbing, cyanosis or edema. She's good range motion of her joints. Skin exam shows no rashes, ecchymoses or petechia. Her skin might be somewhat dry. Neurological exam shows no focal neurological deficits.  Lab Results  Component Value Date   WBC 5.5 01/22/2017   HGB 14.5 01/22/2017   HCT 42.7 01/22/2017   MCV 82 01/22/2017   PLT 228 01/22/2017     Chemistry      Component Value Date/Time  NA 137 12/15/2016 1050   K 4.2 12/15/2016 1050   CL 106 08/07/2016 1550   CO2 26 12/15/2016 1050   BUN 11.1 12/15/2016 1050   CREATININE 0.9 12/15/2016 1050      Component Value Date/Time   CALCIUM 9.1 12/15/2016 1050   ALKPHOS 66 12/15/2016 1050   AST 15 12/15/2016 1050   ALT <6 12/15/2016 1050   BILITOT 0.45 12/15/2016 1050         Impression and Plan: Nicole Booth is A 41 year old African American female. She is still iron deficient.  She had her hysterectomy so this really shouldn't take care of the iron deficiency. We will have to recheck her iron  studies in about 3 months.  I'm glad that she is feeling better. I'm glad that everything well well with the hysterectomy.   [name], MD 2/16/20182:41 PM

## 2017-01-25 LAB — IRON AND TIBC
%SAT: 36 % (ref 21–57)
Iron: 106 ug/dL (ref 41–142)
TIBC: 299 ug/dL (ref 236–444)
UIBC: 193 ug/dL (ref 120–384)

## 2017-01-25 LAB — FERRITIN: Ferritin: 242 ng/ml (ref 9–269)

## 2017-02-13 DIAGNOSIS — K080 Exfoliation of teeth due to systemic causes: Secondary | ICD-10-CM | POA: Diagnosis not present

## 2017-03-17 DIAGNOSIS — M222X2 Patellofemoral disorders, left knee: Secondary | ICD-10-CM | POA: Diagnosis not present

## 2017-03-17 DIAGNOSIS — Z6837 Body mass index (BMI) 37.0-37.9, adult: Secondary | ICD-10-CM | POA: Diagnosis not present

## 2017-04-30 ENCOUNTER — Encounter: Payer: Self-pay | Admitting: *Deleted

## 2017-04-30 ENCOUNTER — Ambulatory Visit (HOSPITAL_BASED_OUTPATIENT_CLINIC_OR_DEPARTMENT_OTHER): Payer: Federal, State, Local not specified - PPO | Admitting: Hematology & Oncology

## 2017-04-30 ENCOUNTER — Other Ambulatory Visit (HOSPITAL_BASED_OUTPATIENT_CLINIC_OR_DEPARTMENT_OTHER): Payer: Federal, State, Local not specified - PPO

## 2017-04-30 VITALS — BP 110/77 | HR 69 | Temp 98.6°F | Resp 18 | Wt 204.1 lb

## 2017-04-30 DIAGNOSIS — D500 Iron deficiency anemia secondary to blood loss (chronic): Secondary | ICD-10-CM

## 2017-04-30 DIAGNOSIS — N921 Excessive and frequent menstruation with irregular cycle: Secondary | ICD-10-CM | POA: Diagnosis not present

## 2017-04-30 DIAGNOSIS — D560 Alpha thalassemia: Secondary | ICD-10-CM

## 2017-04-30 DIAGNOSIS — K580 Irritable bowel syndrome with diarrhea: Secondary | ICD-10-CM | POA: Diagnosis not present

## 2017-04-30 DIAGNOSIS — K909 Intestinal malabsorption, unspecified: Secondary | ICD-10-CM

## 2017-04-30 LAB — CBC WITH DIFFERENTIAL (CANCER CENTER ONLY)
BASO#: 0 10*3/uL (ref 0.0–0.2)
BASO%: 0.6 % (ref 0.0–2.0)
EOS%: 3.4 % (ref 0.0–7.0)
Eosinophils Absolute: 0.2 10*3/uL (ref 0.0–0.5)
HCT: 43.5 % (ref 34.8–46.6)
HGB: 14.8 g/dL (ref 11.6–15.9)
LYMPH#: 1.2 10*3/uL (ref 0.9–3.3)
LYMPH%: 24.3 % (ref 14.0–48.0)
MCH: 29.8 pg (ref 26.0–34.0)
MCHC: 34 g/dL (ref 32.0–36.0)
MCV: 88 fL (ref 81–101)
MONO#: 0.3 10*3/uL (ref 0.1–0.9)
MONO%: 6.7 % (ref 0.0–13.0)
NEUT#: 3.2 10*3/uL (ref 1.5–6.5)
NEUT%: 65 % (ref 39.6–80.0)
Platelets: 270 10*3/uL (ref 145–400)
RBC: 4.96 10*6/uL (ref 3.70–5.32)
RDW: 13 % (ref 11.1–15.7)
WBC: 4.9 10*3/uL (ref 3.9–10.0)

## 2017-04-30 LAB — IRON AND TIBC
%SAT: 37 % (ref 21–57)
Iron: 106 ug/dL (ref 41–142)
TIBC: 285 ug/dL (ref 236–444)
UIBC: 179 ug/dL (ref 120–384)

## 2017-04-30 LAB — FERRITIN: Ferritin: 113 ng/ml (ref 9–269)

## 2017-04-30 MED ORDER — FOLIC ACID 1 MG PO TABS: 1.0000 mg | ORAL_TABLET | Freq: Every day | ORAL | 9 refills | Status: DC

## 2017-04-30 NOTE — Progress Notes (Signed)
Hematology and Oncology Follow Up Visit  Nicole Booth [id number] 1976-10-14 41 y.o. 04/30/2017   Principle Diagnosis:   Iron deficiency anemia secondary to menometrorrhagia  Current Therapy:    Status post hysterectomy IV iron as indicated-last dose given on 76/19/5093  Folic acid 1 mg po q day     Interim History:  Ms. Nicole Booth is back for follow-up. She is doing well. She has really had no specific complaints. There's been no bleeding. She had her hysterectomy back in September.  We last saw her in February, her ferritin was 242 with an iron saturation 36%. Clearly, the hysterectomy helped.  She has had no nausea or vomiting. She's had no cough. She's had no infections. She's had no influenza.  There's been no change in bowel or bladder habits.  She has denied any kind of leg swelling.  She's had no change in medications. She is now on Care for her bladder.  Overall, her performance status is ECOG 0.  Medications:  Current Outpatient Prescriptions:  .  solifenacin (VESICARE) 10 MG tablet, Take 10 mg by mouth., Disp: , Rfl:  .  folic acid (FOLVITE) 1 MG tablet, Take 1 tablet (1 mg total) by mouth daily., Disp: 90 tablet, Rfl: 9 .  hyoscyamine (ANASPAZ) 0.125 MG TBDP disintergrating tablet, Place 0.125 mg under the tongue every 4 (four) hours as needed for bladder spasms. , Disp: , Rfl: 6 .  hyoscyamine (LEVBID) 0.375 MG 12 hr tablet, Take 1 tablet (0.375 mg total) by mouth at bedtime., Disp: 60 tablet, Rfl: 6 .  ibuprofen (ADVIL,MOTRIN) 800 MG tablet, Take 1 tablet (800 mg total) by mouth every 8 (eight) hours as needed (mild pain)., Disp: 30 tablet, Rfl: 1  Allergies:  Allergies  Allergen Reactions  . Penicillins Hives and Rash    Has patient had a PCN reaction causing immediate rash, facial/tongue/throat swelling, SOB or lightheadedness with hypotension: Yes Has patient had a PCN reaction causing severe rash involving mucus membranes or skin necrosis: No Has patient  had a PCN reaction that required hospitalization No Has patient had a PCN reaction occurring within the last 10 years: No If all of the above answers are "NO", then may proceed with Cephalosporin use.   . Dairy Aid [Lactase] Diarrhea  . Shellfish-Derived Products Hives    Itching    Past Medical History, Surgical history, Social history, and Family History were reviewed and updated.  Review of Systems:  As above  Physical Exam:  weight is 204 lb 1.9 oz (92.6 kg). Her oral temperature is 98.6 F (37 C). Her blood pressure is 110/77 and her pulse is 69. Her respiration is 18 and oxygen saturation is 99%.   Wt Readings from Last 3 Encounters:  04/30/17 204 lb 1.9 oz (92.6 kg)  01/22/17 207 lb 1.9 oz (93.9 kg)  12/18/16 211 lb (95.7 kg)      Obese African-American female in no obvious distress. Head exam shows no ocular or oral lesions. There are no palpable cervical or supraclavicular nodes. Lungs are clear bilaterally. Cardiac exam regular rate and rhythm with no murmurs, rubs or bruits. Abdomen is soft. She is obese. She has decent bowel sounds. There is no fluid wave. There is no palpable liver or spleen tip. Externally shows no clubbing, cyanosis or edema. She's good range motion of her joints. Skin exam shows no rashes, ecchymoses or petechia. Her skin might be somewhat dry. Neurological exam shows no focal neurological deficits.  Lab Results  Component Value  Date   WBC 4.9 04/30/2017   HGB 14.8 04/30/2017   HCT 43.5 04/30/2017   MCV 88 04/30/2017   PLT 270 04/30/2017     Chemistry      Component Value Date/Time   NA 134 01/22/2017 1341   NA 137 12/15/2016 1050   K 3.6 01/22/2017 1341   K 4.2 12/15/2016 1050   CL 104 01/22/2017 1341   CO2 23 01/22/2017 1341   CO2 26 12/15/2016 1050   BUN 13 01/22/2017 1341   BUN 11.1 12/15/2016 1050   CREATININE 1.07 (H) 01/22/2017 1341   CREATININE 0.9 12/15/2016 1050      Component Value Date/Time   CALCIUM 9.9 01/22/2017 1341     CALCIUM 9.1 12/15/2016 1050   ALKPHOS 60 01/22/2017 1341   ALKPHOS 66 12/15/2016 1050   AST 11 01/22/2017 1341   AST 15 12/15/2016 1050   ALT 5 01/22/2017 1341   ALT <6 12/15/2016 1050   BILITOT 0.4 01/22/2017 1341   BILITOT 0.45 12/15/2016 1050         Impression and Plan: Ms. Nicole Booth is a 41 year old African American female. Her anemia is improving nicely. She had a hysterectomy. This was really her main issue. I am thankful that the hysterectomy has worked so well.  Her MCV continues to climb. This I think will be a good sign that her iron level is okay.  I would like to get on folic acid. I think this may not be a bad idea for her.  We will plan to get her back in 4 months. I think we can get her through the summer time.  [name], MD 5/25/20181:56 PM

## 2017-05-01 LAB — RETICULOCYTES: Reticulocyte Count: 1.7 % (ref 0.6–2.6)

## 2017-07-21 ENCOUNTER — Other Ambulatory Visit: Payer: Self-pay | Admitting: Internal Medicine

## 2017-07-21 DIAGNOSIS — Z1231 Encounter for screening mammogram for malignant neoplasm of breast: Secondary | ICD-10-CM

## 2017-07-27 ENCOUNTER — Other Ambulatory Visit (HOSPITAL_BASED_OUTPATIENT_CLINIC_OR_DEPARTMENT_OTHER): Payer: Federal, State, Local not specified - PPO

## 2017-07-27 DIAGNOSIS — D500 Iron deficiency anemia secondary to blood loss (chronic): Secondary | ICD-10-CM | POA: Diagnosis not present

## 2017-07-27 DIAGNOSIS — D560 Alpha thalassemia: Secondary | ICD-10-CM

## 2017-07-27 DIAGNOSIS — N921 Excessive and frequent menstruation with irregular cycle: Secondary | ICD-10-CM | POA: Diagnosis not present

## 2017-07-27 DIAGNOSIS — K909 Intestinal malabsorption, unspecified: Secondary | ICD-10-CM | POA: Diagnosis not present

## 2017-07-27 LAB — CBC WITH DIFFERENTIAL (CANCER CENTER ONLY)
BASO#: 0 10*3/uL (ref 0.0–0.2)
BASO%: 0.5 % (ref 0.0–2.0)
EOS%: 3.6 % (ref 0.0–7.0)
Eosinophils Absolute: 0.2 10*3/uL (ref 0.0–0.5)
HCT: 42.9 % (ref 34.8–46.6)
HGB: 14.8 g/dL (ref 11.6–15.9)
LYMPH#: 1.4 10*3/uL (ref 0.9–3.3)
LYMPH%: 22.7 % (ref 14.0–48.0)
MCH: 30.7 pg (ref 26.0–34.0)
MCHC: 34.5 g/dL (ref 32.0–36.0)
MCV: 89 fL (ref 81–101)
MONO#: 0.4 10*3/uL (ref 0.1–0.9)
MONO%: 7 % (ref 0.0–13.0)
NEUT#: 4.1 10*3/uL (ref 1.5–6.5)
NEUT%: 66.2 % (ref 39.6–80.0)
Platelets: 256 10*3/uL (ref 145–400)
RBC: 4.82 10*6/uL (ref 3.70–5.32)
RDW: 12.6 % (ref 11.1–15.7)
WBC: 6.2 10*3/uL (ref 3.9–10.0)

## 2017-07-28 ENCOUNTER — Ambulatory Visit (HOSPITAL_BASED_OUTPATIENT_CLINIC_OR_DEPARTMENT_OTHER): Payer: Federal, State, Local not specified - PPO | Admitting: Hematology & Oncology

## 2017-07-28 ENCOUNTER — Ambulatory Visit (HOSPITAL_BASED_OUTPATIENT_CLINIC_OR_DEPARTMENT_OTHER): Payer: Federal, State, Local not specified - PPO

## 2017-07-28 VITALS — BP 118/66 | HR 63 | Temp 98.5°F | Resp 18 | Wt 217.0 lb

## 2017-07-28 VITALS — BP 120/68 | HR 60 | Temp 98.0°F | Resp 20

## 2017-07-28 DIAGNOSIS — D510 Vitamin B12 deficiency anemia due to intrinsic factor deficiency: Secondary | ICD-10-CM

## 2017-07-28 DIAGNOSIS — M818 Other osteoporosis without current pathological fracture: Secondary | ICD-10-CM

## 2017-07-28 DIAGNOSIS — N921 Excessive and frequent menstruation with irregular cycle: Secondary | ICD-10-CM

## 2017-07-28 DIAGNOSIS — K909 Intestinal malabsorption, unspecified: Secondary | ICD-10-CM

## 2017-07-28 DIAGNOSIS — D500 Iron deficiency anemia secondary to blood loss (chronic): Secondary | ICD-10-CM

## 2017-07-28 DIAGNOSIS — R635 Abnormal weight gain: Secondary | ICD-10-CM

## 2017-07-28 LAB — IRON AND TIBC
%SAT: 19 % — ABNORMAL LOW (ref 21–57)
Iron: 59 ug/dL (ref 41–142)
TIBC: 315 ug/dL (ref 236–444)
UIBC: 256 ug/dL (ref 120–384)

## 2017-07-28 LAB — RETICULOCYTES: Reticulocyte Count: 1.9 % (ref 0.6–2.6)

## 2017-07-28 LAB — FERRITIN: Ferritin: 49 ng/ml (ref 9–269)

## 2017-07-28 MED ORDER — DIPHENHYDRAMINE HCL 25 MG PO CAPS
25.0000 mg | ORAL_CAPSULE | Freq: Once | ORAL | Status: AC
  Administered 2017-07-28: 25 mg via ORAL

## 2017-07-28 MED ORDER — FAMOTIDINE IN NACL 20-0.9 MG/50ML-% IV SOLN
20.0000 mg | Freq: Once | INTRAVENOUS | Status: AC
  Administered 2017-07-28: 20 mg via INTRAVENOUS

## 2017-07-28 MED ORDER — FERUMOXYTOL INJECTION 510 MG/17 ML
510.0000 mg | Freq: Once | INTRAVENOUS | Status: AC
  Administered 2017-07-28: 510 mg via INTRAVENOUS
  Filled 2017-07-28: qty 17

## 2017-07-28 MED ORDER — DIPHENHYDRAMINE HCL 25 MG PO CAPS
ORAL_CAPSULE | ORAL | Status: AC
  Filled 2017-07-28: qty 1

## 2017-07-28 MED ORDER — FAMOTIDINE IN NACL 20-0.9 MG/50ML-% IV SOLN
INTRAVENOUS | Status: AC
  Filled 2017-07-28: qty 50

## 2017-07-28 NOTE — Progress Notes (Signed)
Hematology and Oncology Follow Up Visit  Nicole Booth [id number] 23-Feb-1976 41 y.o. 07/28/2017   Principle Diagnosis:   Iron deficiency anemia secondary to menometrorrhagia  Current Therapy:    Status post hysterectomy        IV iron as indicated-last dose given on 40/98/1191        Folic acid 1 mg po q day     Interim History:  Nicole Booth is back for follow-up. She is feeling a little tired. She had her hysterectomy last year. We have not had a give her iron for 8 months.  She had iron studies done yesterday. I reviewed the iron studies. Her ferritin was down to 49. This is quite low for her. She has rheumatologic issues. Typically, any ferritin less than 100 is a sign of iron deficiency. Her iron saturation was only 19%.  We'll I think we have to do stool guaiacs on her. His been 2 years since she had a guaiac done. I need to make sure that she is not this in iron elsewhere.  I also told her to try some vitamin C. I told her to try 500 mg of vitamin C twice a day. This may help her absorb more iron.   She has had no problems with work. She is can raise go to her son's open house at school. He will be in second grade.   There's been no nausea or vomiting. She's had no change in bowel or bladder habits. She's had no leg swelling. She's had no rashes.   Overall, her performance status is ECOG 0.  Medications:  Current Outpatient Prescriptions:  .  gabapentin (NEURONTIN) 300 MG capsule, Take 300 mg by mouth 2 (two) times daily., Disp: , Rfl:  .  folic acid (FOLVITE) 1 MG tablet, Take 1 tablet (1 mg total) by mouth daily., Disp: 90 tablet, Rfl: 9 .  hyoscyamine (ANASPAZ) 0.125 MG TBDP disintergrating tablet, Place 0.125 mg under the tongue every 4 (four) hours as needed for bladder spasms. , Disp: , Rfl: 6 .  hyoscyamine (LEVBID) 0.375 MG 12 hr tablet, Take 1 tablet (0.375 mg total) by mouth at bedtime., Disp: 60 tablet, Rfl: 6 .  ibuprofen (ADVIL,MOTRIN) 800 MG tablet, Take  1 tablet (800 mg total) by mouth every 8 (eight) hours as needed (mild pain)., Disp: 30 tablet, Rfl: 1 .  solifenacin (VESICARE) 10 MG tablet, Take 10 mg by mouth., Disp: , Rfl:   Allergies:  Allergies  Allergen Reactions  . Penicillins Hives and Rash    Has patient had a PCN reaction causing immediate rash, facial/tongue/throat swelling, SOB or lightheadedness with hypotension: Yes Has patient had a PCN reaction causing severe rash involving mucus membranes or skin necrosis: No Has patient had a PCN reaction that required hospitalization No Has patient had a PCN reaction occurring within the last 10 years: No If all of the above answers are "NO", then may proceed with Cephalosporin use.   . Dairy Aid [Lactase] Diarrhea  . Shellfish-Derived Products Hives    Itching    Past Medical History, Surgical history, Social history, and Family Hx: reviewed the past history, etc. Everything is as listed above in the interim history.  Review of Systems:  As above  Physical Exam:  weight is 217 lb (98.4 kg). Her oral temperature is 98.5 F (36.9 C). Her blood pressure is 118/66 and her pulse is 63. Her respiration is 18 and oxygen saturation is 100%.   Wt Readings from Last  3 Encounters:  07/28/17 217 lb (98.4 kg)  04/30/17 204 lb 1.9 oz (92.6 kg)  01/22/17 207 lb 1.9 oz (93.9 kg)      Well-developed and well-nourished African-American female. Head and neck exam shows no ocular or oral lesions shows no conjunctival paleness. There is no adenopathy in the neck. Her thyroid is slightly full. Lungs are clear. Cardiac exam regular rate and rhythm. She has no murmurs rubs or bruits. Abdomen is soft. She has decent bowel sounds. There is no fluid wave. There is no palpable liver or spleen tip. Back exam shows no tenderness over the spine, ribs or hips. Extremity shows no clubbing, cyanosis or edema. She has good range of motion of her joints. Skin exam shows no rashes, ecchymoses or petechia.   Neurological exam shows no focal neurological deficits.  Lab Results  Component Value Date   WBC 6.2 07/27/2017   HGB 14.8 07/27/2017   HCT 42.9 07/27/2017   MCV 89 07/27/2017   PLT 256 07/27/2017     Chemistry      Component Value Date/Time   NA 134 01/22/2017 1341   NA 137 12/15/2016 1050   K 3.6 01/22/2017 1341   K 4.2 12/15/2016 1050   CL 104 01/22/2017 1341   CO2 23 01/22/2017 1341   CO2 26 12/15/2016 1050   BUN 13 01/22/2017 1341   BUN 11.1 12/15/2016 1050   CREATININE 1.07 (H) 01/22/2017 1341   CREATININE 0.9 12/15/2016 1050      Component Value Date/Time   CALCIUM 9.9 01/22/2017 1341   CALCIUM 9.1 12/15/2016 1050   ALKPHOS 60 01/22/2017 1341   ALKPHOS 66 12/15/2016 1050   AST 11 01/22/2017 1341   AST 15 12/15/2016 1050   ALT 5 01/22/2017 1341   ALT <6 12/15/2016 1050   BILITOT 0.4 01/22/2017 1341   BILITOT 0.45 12/15/2016 1050         Impression and Plan: Nicole Booth is a 41 year old African-American female. Her iron is low. I'm not sure as to why. She might have malabsorption issues. I would not think that she is bleeding elsewhere. She's not noted any melena or bright red blood per rectum.  We will go ahead and give her IV iron today. We will use Feraheme. I note that this will make her feel better.  I will have to check her thyroid. We'll check a TSH on her. I also want to check a vitamin B-12 level.   We will get her back in another 6 weeks. Hopefully, she will be feeling better and the iron that she got today will help.  I spent about 25 minutes with her today. Most of this time was spent in counseling.  [name], MD 8/22/20181:51 PM

## 2017-07-28 NOTE — Patient Instructions (Signed)

## 2017-08-04 LAB — ALPHA-THALASSEMIA GENOTYPR

## 2017-09-08 ENCOUNTER — Other Ambulatory Visit: Payer: Federal, State, Local not specified - PPO

## 2017-09-10 ENCOUNTER — Ambulatory Visit: Payer: Federal, State, Local not specified - PPO | Admitting: Hematology & Oncology

## 2017-09-10 ENCOUNTER — Ambulatory Visit: Payer: Federal, State, Local not specified - PPO

## 2017-09-10 ENCOUNTER — Ambulatory Visit
Admission: RE | Admit: 2017-09-10 | Discharge: 2017-09-10 | Disposition: A | Discharge status: Home / Self Care | Payer: Federal, State, Local not specified - PPO | Source: Ambulatory Visit | Attending: Internal Medicine | Admitting: Internal Medicine

## 2017-09-10 DIAGNOSIS — Z1231 Encounter for screening mammogram for malignant neoplasm of breast: Secondary | ICD-10-CM | POA: Diagnosis not present

## 2017-09-11 DIAGNOSIS — K080 Exfoliation of teeth due to systemic causes: Secondary | ICD-10-CM | POA: Diagnosis not present

## 2017-09-14 DIAGNOSIS — Z0000 Encounter for general adult medical examination without abnormal findings: Secondary | ICD-10-CM | POA: Diagnosis not present

## 2017-09-14 DIAGNOSIS — Z113 Encounter for screening for infections with a predominantly sexual mode of transmission: Secondary | ICD-10-CM | POA: Diagnosis not present

## 2017-09-14 DIAGNOSIS — K909 Intestinal malabsorption, unspecified: Secondary | ICD-10-CM | POA: Diagnosis not present

## 2017-09-14 DIAGNOSIS — D509 Iron deficiency anemia, unspecified: Secondary | ICD-10-CM | POA: Diagnosis not present

## 2017-09-14 DIAGNOSIS — A6000 Herpesviral infection of urogenital system, unspecified: Secondary | ICD-10-CM | POA: Diagnosis not present

## 2017-09-14 DIAGNOSIS — Z23 Encounter for immunization: Secondary | ICD-10-CM | POA: Diagnosis not present

## 2017-09-16 ENCOUNTER — Other Ambulatory Visit: Payer: Federal, State, Local not specified - PPO

## 2017-09-17 ENCOUNTER — Ambulatory Visit: Payer: Federal, State, Local not specified - PPO

## 2017-09-17 ENCOUNTER — Ambulatory Visit: Payer: Federal, State, Local not specified - PPO | Admitting: Hematology & Oncology

## 2017-09-20 DIAGNOSIS — A530 Latent syphilis, unspecified as early or late: Secondary | ICD-10-CM | POA: Diagnosis not present

## 2017-09-30 DIAGNOSIS — R7989 Other specified abnormal findings of blood chemistry: Secondary | ICD-10-CM | POA: Diagnosis not present

## 2017-10-07 ENCOUNTER — Other Ambulatory Visit (HOSPITAL_BASED_OUTPATIENT_CLINIC_OR_DEPARTMENT_OTHER): Payer: Federal, State, Local not specified - PPO

## 2017-10-07 DIAGNOSIS — K909 Intestinal malabsorption, unspecified: Secondary | ICD-10-CM

## 2017-10-07 DIAGNOSIS — R635 Abnormal weight gain: Secondary | ICD-10-CM

## 2017-10-07 DIAGNOSIS — M818 Other osteoporosis without current pathological fracture: Secondary | ICD-10-CM | POA: Diagnosis not present

## 2017-10-07 DIAGNOSIS — N921 Excessive and frequent menstruation with irregular cycle: Secondary | ICD-10-CM

## 2017-10-07 DIAGNOSIS — D510 Vitamin B12 deficiency anemia due to intrinsic factor deficiency: Secondary | ICD-10-CM | POA: Diagnosis not present

## 2017-10-07 DIAGNOSIS — D500 Iron deficiency anemia secondary to blood loss (chronic): Secondary | ICD-10-CM

## 2017-10-07 LAB — CBC WITH DIFFERENTIAL (CANCER CENTER ONLY)
BASO#: 0 10*3/uL (ref 0.0–0.2)
BASO%: 0.4 % (ref 0.0–2.0)
EOS%: 3.7 % (ref 0.0–7.0)
Eosinophils Absolute: 0.2 10*3/uL (ref 0.0–0.5)
HCT: 42.8 % (ref 34.8–46.6)
HGB: 14.8 g/dL (ref 11.6–15.9)
LYMPH#: 1.2 10*3/uL (ref 0.9–3.3)
LYMPH%: 22.5 % (ref 14.0–48.0)
MCH: 30.8 pg (ref 26.0–34.0)
MCHC: 34.6 g/dL (ref 32.0–36.0)
MCV: 89 fL (ref 81–101)
MONO#: 0.4 10*3/uL (ref 0.1–0.9)
MONO%: 7.3 % (ref 0.0–13.0)
NEUT#: 3.6 10*3/uL (ref 1.5–6.5)
NEUT%: 66.1 % (ref 39.6–80.0)
Platelets: 246 10*3/uL (ref 145–400)
RBC: 4.8 10*6/uL (ref 3.70–5.32)
RDW: 12.7 % (ref 11.1–15.7)
WBC: 5.5 10*3/uL (ref 3.9–10.0)

## 2017-10-08 ENCOUNTER — Ambulatory Visit: Payer: Federal, State, Local not specified - PPO

## 2017-10-08 ENCOUNTER — Ambulatory Visit: Payer: Federal, State, Local not specified - PPO | Admitting: Hematology & Oncology

## 2017-10-08 LAB — VITAMIN D 25 HYDROXY (VIT D DEFICIENCY, FRACTURES): Vitamin D, 25-Hydroxy: 12.8 ng/mL — ABNORMAL LOW (ref 30.0–100.0)

## 2017-10-08 LAB — IRON AND TIBC
%SAT: 33 % (ref 21–57)
Iron: 92 ug/dL (ref 41–142)
TIBC: 280 ug/dL (ref 236–444)
UIBC: 188 ug/dL (ref 120–384)

## 2017-10-08 LAB — FERRITIN: Ferritin: 159 ng/ml (ref 9–269)

## 2017-10-08 LAB — TSH: TSH: 2.869 m(IU)/L (ref 0.308–3.960)

## 2017-10-08 LAB — VITAMIN B12: Vitamin B12: 322 pg/mL (ref 232–1245)

## 2017-10-11 ENCOUNTER — Telehealth: Payer: Self-pay

## 2017-10-11 ENCOUNTER — Other Ambulatory Visit: Payer: Self-pay | Admitting: Family

## 2017-10-11 DIAGNOSIS — E559 Vitamin D deficiency, unspecified: Secondary | ICD-10-CM

## 2017-10-11 MED ORDER — ERGOCALCIFEROL 1.25 MG (50000 UT) PO CAPS: 50000.0000 [IU] | ORAL_CAPSULE | ORAL | 5 refills | Status: DC

## 2017-10-11 NOTE — Telephone Encounter (Addendum)
-----   Message from Eliezer Bottom, NP sent at 10/11/2017 11:58 AM EST ----- Regarding: Vit D low Prescription sent for Vit D 50,000 units [facility] to [facility]. We will; plan to see her back in another month for follow-up and recheck labs at that time. Scheduling message sent to [facility]. Thank you! Sarah  Above message given to pt via phone who verbalizes understanding. Pt wishes to have script sent to [facility]. Script transferred. dph

## 2017-10-18 DIAGNOSIS — A530 Latent syphilis, unspecified as early or late: Secondary | ICD-10-CM | POA: Diagnosis not present

## 2017-11-03 DIAGNOSIS — Z01419 Encounter for gynecological examination (general) (routine) without abnormal findings: Secondary | ICD-10-CM | POA: Diagnosis not present

## 2017-11-25 DIAGNOSIS — G43109 Migraine with aura, not intractable, without status migrainosus: Secondary | ICD-10-CM | POA: Diagnosis not present

## 2017-11-25 DIAGNOSIS — N393 Stress incontinence (female) (male): Secondary | ICD-10-CM | POA: Diagnosis not present

## 2017-11-25 DIAGNOSIS — K219 Gastro-esophageal reflux disease without esophagitis: Secondary | ICD-10-CM | POA: Diagnosis not present

## 2018-01-06 ENCOUNTER — Other Ambulatory Visit: Payer: Self-pay | Admitting: *Deleted

## 2018-01-06 DIAGNOSIS — K909 Intestinal malabsorption, unspecified: Secondary | ICD-10-CM

## 2018-01-06 DIAGNOSIS — D500 Iron deficiency anemia secondary to blood loss (chronic): Secondary | ICD-10-CM

## 2018-01-07 ENCOUNTER — Inpatient Hospital Stay: Payer: Federal, State, Local not specified - PPO

## 2018-01-07 ENCOUNTER — Other Ambulatory Visit: Payer: Self-pay

## 2018-01-07 ENCOUNTER — Inpatient Hospital Stay: Payer: Federal, State, Local not specified - PPO | Attending: Hematology & Oncology | Admitting: Hematology & Oncology

## 2018-01-07 ENCOUNTER — Encounter: Payer: Self-pay | Admitting: Hematology & Oncology

## 2018-01-07 VITALS — BP 100/54 | HR 92 | Temp 98.5°F | Wt 235.4 lb

## 2018-01-07 DIAGNOSIS — Z90710 Acquired absence of both cervix and uterus: Secondary | ICD-10-CM | POA: Diagnosis not present

## 2018-01-07 DIAGNOSIS — Z880 Allergy status to penicillin: Secondary | ICD-10-CM | POA: Diagnosis not present

## 2018-01-07 DIAGNOSIS — R635 Abnormal weight gain: Secondary | ICD-10-CM

## 2018-01-07 DIAGNOSIS — D500 Iron deficiency anemia secondary to blood loss (chronic): Secondary | ICD-10-CM

## 2018-01-07 DIAGNOSIS — Z79899 Other long term (current) drug therapy: Secondary | ICD-10-CM

## 2018-01-07 DIAGNOSIS — R609 Edema, unspecified: Secondary | ICD-10-CM

## 2018-01-07 DIAGNOSIS — N921 Excessive and frequent menstruation with irregular cycle: Secondary | ICD-10-CM | POA: Diagnosis not present

## 2018-01-07 DIAGNOSIS — R197 Diarrhea, unspecified: Secondary | ICD-10-CM | POA: Diagnosis not present

## 2018-01-07 DIAGNOSIS — E559 Vitamin D deficiency, unspecified: Secondary | ICD-10-CM

## 2018-01-07 DIAGNOSIS — K909 Intestinal malabsorption, unspecified: Secondary | ICD-10-CM

## 2018-01-07 LAB — CBC WITH DIFFERENTIAL (CANCER CENTER ONLY)
Basophils Absolute: 0 10*3/uL (ref 0.0–0.1)
Basophils Relative: 0 %
Eosinophils Absolute: 0.2 10*3/uL (ref 0.0–0.5)
Eosinophils Relative: 3 %
HCT: 43.3 % (ref 34.8–46.6)
Hemoglobin: 14.7 g/dL (ref 11.6–15.9)
Lymphocytes Relative: 20 %
Lymphs Abs: 1.4 10*3/uL (ref 0.9–3.3)
MCH: 30.2 pg (ref 26.0–34.0)
MCHC: 33.9 g/dL (ref 32.0–36.0)
MCV: 88.9 fL (ref 81.0–101.0)
Monocytes Absolute: 0.6 10*3/uL (ref 0.1–0.9)
Monocytes Relative: 8 %
Neutro Abs: 5 10*3/uL (ref 1.5–6.5)
Neutrophils Relative %: 69 %
Platelet Count: 297 10*3/uL (ref 145–400)
RBC: 4.87 MIL/uL (ref 3.70–5.32)
RDW: 12.5 % (ref 11.1–15.7)
WBC Count: 7.2 10*3/uL (ref 3.9–10.0)

## 2018-01-07 LAB — FERRITIN: Ferritin: 108 ng/mL (ref 11–307)

## 2018-01-07 LAB — CMP (CANCER CENTER ONLY)
ALT: 12 U/L (ref 0–55)
AST: 18 U/L (ref 5–34)
Albumin: 4 g/dL (ref 3.5–5.0)
Alkaline Phosphatase: 56 U/L (ref 26–84)
Anion gap: 5 (ref 5–15)
BUN: 11 mg/dL (ref 7–22)
CO2: 30 mmol/L (ref 18–33)
Calcium: 8.9 mg/dL (ref 8.0–10.3)
Chloride: 105 mmol/L (ref 98–108)
Creatinine: 1 mg/dL (ref 0.60–1.10)
Glucose, Bld: 96 mg/dL (ref 73–118)
Potassium: 4 mmol/L (ref 3.3–4.7)
Sodium: 140 mmol/L (ref 128–145)
Total Bilirubin: 0.7 mg/dL (ref 0.2–1.2)
Total Protein: 7.4 g/dL (ref 6.4–8.1)

## 2018-01-07 LAB — VITAMIN B12: Vitamin B-12: 203 pg/mL (ref 180–914)

## 2018-01-07 LAB — IRON AND TIBC
Iron: 57 ug/dL (ref 28–170)
Saturation Ratios: 18 % (ref 10.4–31.8)
TIBC: 323 ug/dL (ref 250–450)
UIBC: 266 ug/dL

## 2018-01-07 NOTE — Progress Notes (Signed)
Hematology and Oncology Follow Up Visit  Nicole Booth [id number] Apr 30, 1976 42 y.o. 01/07/2018   Principle Diagnosis:   Iron deficiency anemia secondary to menometrorrhagia  Current Therapy:    Status post hysterectomy        IV iron as indicated-last dose given on 75/09/2584        Folic acid 1 mg po q day     Interim History:  Nicole Booth is back for follow-up.  She has gained quite a bit of weight.  She is not happy about this.  She also feels cold all the time.  She is just very concerned about her overall health status.  She is very frustrated that nothing has been found that can be attributed to her problems.  As far as her iron deficiency is concerned, we have not had this as a problem for a while.  She had her hysterectomy back in 2017.  Since then, her iron levels have been doing pretty well.  When we saw her in August, her ferritin was 49 with an iron saturation of 19%.  We did go ahead and give her a dose of iron at that point.  We have checked her thyroid levels.  Back in November 2018, her TSH was normal at 2.1.  She is not diabetic.  There is really been no change in medications.  She is now off the gabapentin.  She is had no fever.  She has had some bruising.  There is been no change in bowel or bladder habits.  She says she had a little bit of diarrhea this morning.  She has had no mouth sores.  There is been no visual changes.  She has had no headache.  Overall, her performance status is ECOG 0.  Medications:  Current Outpatient Medications:  .  ergocalciferol (VITAMIN D2) 50000 units capsule, Take 1 capsule (50,000 Units total) once a week by mouth., Disp: 8 capsule, Rfl: 5 .  folic acid (FOLVITE) 1 MG tablet, Take 1 tablet (1 mg total) by mouth daily., Disp: 90 tablet, Rfl: 9 .  gabapentin (NEURONTIN) 300 MG capsule, Take 300 mg by mouth 2 (two) times daily., Disp: , Rfl:  .  hyoscyamine (ANASPAZ) 0.125 MG TBDP disintergrating tablet, Place 0.125 mg  under the tongue every 4 (four) hours as needed for bladder spasms. , Disp: , Rfl: 6 .  hyoscyamine (LEVBID) 0.375 MG 12 hr tablet, Take 1 tablet (0.375 mg total) by mouth at bedtime., Disp: 60 tablet, Rfl: 6 .  ibuprofen (ADVIL,MOTRIN) 800 MG tablet, Take 1 tablet (800 mg total) by mouth every 8 (eight) hours as needed (mild pain)., Disp: 30 tablet, Rfl: 1 .  solifenacin (VESICARE) 10 MG tablet, Take 10 mg by mouth., Disp: , Rfl:   Allergies:  Allergies  Allergen Reactions  . Penicillins Hives and Rash    Has patient had a PCN reaction causing immediate rash, facial/tongue/throat swelling, SOB or lightheadedness with hypotension: Yes Has patient had a PCN reaction causing severe rash involving mucus membranes or skin necrosis: No Has patient had a PCN reaction that required hospitalization No Has patient had a PCN reaction occurring within the last 10 years: No If all of the above answers are "NO", then may proceed with Cephalosporin use.   . Dairy Aid [Lactase] Diarrhea  . Shellfish-Derived Products Hives    Itching    Past Medical History, Surgical history, Social history, and Family Hx: reviewed the past history, etc. Everything is as listed above in  the interim history.  Review of Systems: Review of Systems  Constitutional: Negative.   HENT: Negative.   Eyes: Negative.   Respiratory: Negative.   Cardiovascular: Negative.   Gastrointestinal: Negative.   Genitourinary: Negative.   Musculoskeletal: Negative.   Skin: Negative.   Neurological: Negative.   Endo/Heme/Allergies: Negative.   Psychiatric/Behavioral: Negative.      Physical Exam:  weight is 235 lb 6.4 oz (106.8 kg). Her oral temperature is 98.5 F (36.9 C). Her blood pressure is 100/54 (abnormal) and her pulse is 92. Her oxygen saturation is 100%.   Wt Readings from Last 3 Encounters:  01/07/18 235 lb 6.4 oz (106.8 kg)  07/28/17 217 lb (98.4 kg)  04/30/17 204 lb 1.9 oz (92.6 kg)      Physical Exam    Constitutional: She is oriented to person, place, and time.  HENT:  Head: Normocephalic and atraumatic.  Mouth/Throat: Oropharynx is clear and moist.  Eyes: EOM are normal. Pupils are equal, round, and reactive to light.  Neck: Normal range of motion.  Cardiovascular: Normal rate, regular rhythm and normal heart sounds.  Pulmonary/Chest: Effort normal and breath sounds normal.  Abdominal: Soft. Bowel sounds are normal.  Musculoskeletal: Normal range of motion. She exhibits no edema, tenderness or deformity.  Lymphadenopathy:    She has no cervical adenopathy.  Neurological: She is alert and oriented to person, place, and time.  Skin: Skin is warm and dry. No rash noted. No erythema.  Psychiatric: She has a normal mood and affect. Her behavior is normal. Judgment and thought content normal.  Vitals reviewed.   Lab Results  Component Value Date   WBC 7.2 01/07/2018   HGB 14.8 10/07/2017   HCT 43.3 01/07/2018   MCV 88.9 01/07/2018   PLT 297 01/07/2018     Chemistry      Component Value Date/Time   NA 134 01/22/2017 1341   NA 137 12/15/2016 1050   K 3.6 01/22/2017 1341   K 4.2 12/15/2016 1050   CL 104 01/22/2017 1341   CO2 23 01/22/2017 1341   CO2 26 12/15/2016 1050   BUN 13 01/22/2017 1341   BUN 11.1 12/15/2016 1050   CREATININE 1.07 (H) 01/22/2017 1341   CREATININE 0.9 12/15/2016 1050      Component Value Date/Time   CALCIUM 9.9 01/22/2017 1341   CALCIUM 9.1 12/15/2016 1050   ALKPHOS 60 01/22/2017 1341   ALKPHOS 66 12/15/2016 1050   AST 11 01/22/2017 1341   AST 15 12/15/2016 1050   ALT 5 01/22/2017 1341   ALT <6 12/15/2016 1050   BILITOT 0.4 01/22/2017 1341   BILITOT 0.45 12/15/2016 1050         Impression and Plan: Nicole Booth is a 42 year old African-American female.  At this point, I am still not sure as to what is going on with her.  I am not sure why she has this weight gain.  She has not menopausal.  We checked her thyroid.  I do not think it would  be a bad idea to get an abdominal ultrasound to look at her liver, pancreas, spleen, etc.  May be, there is something that we are not picking up with our lab work.  I spent about 35 minutes with she and her husband.  Over 50% of the time was spent face-to-face with him.  I reviewed all of her labs.  I answered their questions.  I went over my recommendations as to how he should proceed with workup.  They  are in agreement.  I will would like to have her come back in 6 weeks so that we can just follow up closely with her.   [name], MD 2/1/20193:51 PM

## 2018-01-08 LAB — VITAMIN D 25 HYDROXY (VIT D DEFICIENCY, FRACTURES): Vit D, 25-Hydroxy: 19.6 ng/mL — ABNORMAL LOW (ref 30.0–100.0)

## 2018-01-10 LAB — HEMOGLOBINOPATHY EVALUATION
Hgb A2 Quant: 2.4 % (ref 1.8–3.2)
Hgb A: 97.6 % (ref 96.4–98.8)
Hgb C: 0 %
Hgb F Quant: 0 % (ref 0.0–2.0)
Hgb S Quant: 0 %
Hgb Variant: 0 %

## 2018-01-10 LAB — TSH: TSH: 2.225 u[IU]/mL (ref 0.308–3.960)

## 2018-01-12 ENCOUNTER — Ambulatory Visit: Payer: Federal, State, Local not specified - PPO | Admitting: Hematology & Oncology

## 2018-01-12 LAB — ANTINUCLEAR ANTIBODIES, IFA: ANA Ab, IFA: NEGATIVE

## 2018-01-14 ENCOUNTER — Ambulatory Visit (HOSPITAL_BASED_OUTPATIENT_CLINIC_OR_DEPARTMENT_OTHER)
Admission: RE | Admit: 2018-01-14 | Discharge: 2018-01-14 | Disposition: A | Discharge status: Home / Self Care | Payer: Federal, State, Local not specified - PPO | Source: Ambulatory Visit | Attending: Hematology & Oncology | Admitting: Hematology & Oncology

## 2018-01-14 ENCOUNTER — Encounter (HOSPITAL_BASED_OUTPATIENT_CLINIC_OR_DEPARTMENT_OTHER): Payer: Self-pay | Admitting: Radiology

## 2018-01-14 DIAGNOSIS — R635 Abnormal weight gain: Secondary | ICD-10-CM | POA: Diagnosis not present

## 2018-01-14 DIAGNOSIS — R609 Edema, unspecified: Secondary | ICD-10-CM | POA: Insufficient documentation

## 2018-01-14 DIAGNOSIS — R109 Unspecified abdominal pain: Secondary | ICD-10-CM | POA: Diagnosis not present

## 2018-01-27 DIAGNOSIS — K080 Exfoliation of teeth due to systemic causes: Secondary | ICD-10-CM | POA: Diagnosis not present

## 2018-01-31 ENCOUNTER — Other Ambulatory Visit: Payer: Self-pay | Admitting: *Deleted

## 2018-01-31 DIAGNOSIS — E559 Vitamin D deficiency, unspecified: Secondary | ICD-10-CM

## 2018-01-31 MED ORDER — ERGOCALCIFEROL 1.25 MG (50000 UT) PO CAPS: 50000.0000 [IU] | ORAL_CAPSULE | ORAL | 3 refills | Status: DC

## 2018-02-18 ENCOUNTER — Inpatient Hospital Stay: Payer: Federal, State, Local not specified - PPO

## 2018-02-18 ENCOUNTER — Inpatient Hospital Stay: Payer: Federal, State, Local not specified - PPO | Admitting: Hematology & Oncology

## 2018-03-14 ENCOUNTER — Telehealth: Payer: Self-pay | Admitting: *Deleted

## 2018-03-14 ENCOUNTER — Other Ambulatory Visit: Payer: Self-pay | Admitting: *Deleted

## 2018-03-14 DIAGNOSIS — K580 Irritable bowel syndrome with diarrhea: Secondary | ICD-10-CM

## 2018-03-14 DIAGNOSIS — K909 Intestinal malabsorption, unspecified: Secondary | ICD-10-CM

## 2018-03-14 DIAGNOSIS — N921 Excessive and frequent menstruation with irregular cycle: Secondary | ICD-10-CM

## 2018-03-14 MED ORDER — HYOSCYAMINE SULFATE ER 0.375 MG PO TB12: 0.3750 mg | ORAL_TABLET | Freq: Every day | ORAL | 6 refills | Status: AC

## 2018-03-14 MED ORDER — HYOSCYAMINE SULFATE 0.125 MG PO TBDP: 0.1250 mg | ORAL_TABLET | ORAL | 6 refills | Status: AC | PRN

## 2018-03-14 NOTE — Telephone Encounter (Addendum)
-----   Message from [name], MD sent at 03/14/2018  1:08 PM EDT ----- Called patient to let her know that all of the blood tests are ok except for the low Vit D level.  If not on Vit D, she  needs to be on 2000 units a day.  This might make her feel better!! Patient has 20,000 units of Vitamin D at home that she has been taking.  Will continue to take this.

## 2018-04-09 DIAGNOSIS — K080 Exfoliation of teeth due to systemic causes: Secondary | ICD-10-CM | POA: Diagnosis not present

## 2018-05-06 ENCOUNTER — Inpatient Hospital Stay: Payer: Federal, State, Local not specified - PPO

## 2018-05-06 ENCOUNTER — Inpatient Hospital Stay: Payer: Federal, State, Local not specified - PPO | Admitting: Hematology & Oncology

## 2018-05-27 ENCOUNTER — Inpatient Hospital Stay (HOSPITAL_BASED_OUTPATIENT_CLINIC_OR_DEPARTMENT_OTHER): Payer: Federal, State, Local not specified - PPO | Admitting: Hematology & Oncology

## 2018-05-27 ENCOUNTER — Inpatient Hospital Stay: Payer: Federal, State, Local not specified - PPO | Attending: Hematology & Oncology

## 2018-05-27 ENCOUNTER — Encounter: Payer: Self-pay | Admitting: Hematology & Oncology

## 2018-05-27 ENCOUNTER — Other Ambulatory Visit: Payer: Self-pay

## 2018-05-27 VITALS — BP 112/63 | HR 51 | Temp 98.5°F | Resp 18 | Wt 233.0 lb

## 2018-05-27 DIAGNOSIS — D500 Iron deficiency anemia secondary to blood loss (chronic): Secondary | ICD-10-CM

## 2018-05-27 DIAGNOSIS — G4700 Insomnia, unspecified: Secondary | ICD-10-CM | POA: Insufficient documentation

## 2018-05-27 DIAGNOSIS — N921 Excessive and frequent menstruation with irregular cycle: Secondary | ICD-10-CM

## 2018-05-27 DIAGNOSIS — K909 Intestinal malabsorption, unspecified: Secondary | ICD-10-CM

## 2018-05-27 DIAGNOSIS — Z90710 Acquired absence of both cervix and uterus: Secondary | ICD-10-CM

## 2018-05-27 DIAGNOSIS — R635 Abnormal weight gain: Secondary | ICD-10-CM | POA: Diagnosis not present

## 2018-05-27 DIAGNOSIS — Z79899 Other long term (current) drug therapy: Secondary | ICD-10-CM

## 2018-05-27 DIAGNOSIS — E559 Vitamin D deficiency, unspecified: Secondary | ICD-10-CM | POA: Diagnosis not present

## 2018-05-27 DIAGNOSIS — R609 Edema, unspecified: Secondary | ICD-10-CM

## 2018-05-27 LAB — CMP (CANCER CENTER ONLY)
ALT: 15 U/L (ref 10–47)
AST: 19 U/L (ref 11–38)
Albumin: 4.1 g/dL (ref 3.5–5.0)
Alkaline Phosphatase: 69 U/L (ref 26–84)
Anion gap: 12 (ref 5–15)
BUN: 9 mg/dL (ref 7–22)
CO2: 27 mmol/L (ref 18–33)
Calcium: 9.4 mg/dL (ref 8.0–10.3)
Chloride: 106 mmol/L (ref 98–108)
Creatinine: 1.1 mg/dL (ref 0.60–1.20)
Glucose, Bld: 98 mg/dL (ref 73–118)
Potassium: 4.2 mmol/L (ref 3.3–4.7)
Sodium: 145 mmol/L (ref 128–145)
Total Bilirubin: 0.7 mg/dL (ref 0.2–1.6)
Total Protein: 7.5 g/dL (ref 6.4–8.1)

## 2018-05-27 LAB — CBC WITH DIFFERENTIAL (CANCER CENTER ONLY)
Basophils Absolute: 0 10*3/uL (ref 0.0–0.1)
Basophils Relative: 0 %
Eosinophils Absolute: 0.2 10*3/uL (ref 0.0–0.5)
Eosinophils Relative: 2 %
HCT: 44.2 % (ref 34.8–46.6)
Hemoglobin: 14.6 g/dL (ref 11.6–15.9)
Lymphocytes Relative: 18 %
Lymphs Abs: 1.3 10*3/uL (ref 0.9–3.3)
MCH: 29.6 pg (ref 26.0–34.0)
MCHC: 33 g/dL (ref 32.0–36.0)
MCV: 89.7 fL (ref 81.0–101.0)
Monocytes Absolute: 0.5 10*3/uL (ref 0.1–0.9)
Monocytes Relative: 7 %
Neutro Abs: 5.4 10*3/uL (ref 1.5–6.5)
Neutrophils Relative %: 73 %
Platelet Count: 284 10*3/uL (ref 145–400)
RBC: 4.93 MIL/uL (ref 3.70–5.32)
RDW: 13 % (ref 11.1–15.7)
WBC Count: 7.4 10*3/uL (ref 3.9–10.0)

## 2018-05-27 MED ORDER — SUVOREXANT 10 MG PO TABS: 10.0000 mg | ORAL_TABLET | Freq: Every evening | ORAL | 2 refills | Status: DC | PRN

## 2018-05-27 NOTE — Progress Notes (Signed)
Hematology and Oncology Follow Up Visit  Nicole Booth [id number] 08-Dec-1975 41 y.o. 01/07/2018   Principle Diagnosis:   Iron deficiency anemia secondary to menometrorrhagia  Current Therapy:    Status post hysterectomy        IV iron as indicated-last dose given on 84/69/6295        Folic acid 1 mg po q day     Interim History:  Nicole Booth is back for follow-up.  She is doing pretty well.  She started to lose a little bit of weight.  Unfortunately, she lost her family doctor.  It certainly seems as if many family doctors are leaving.  It is quite disheartening that we are not keeping family doctors in [place].  She is not sleeping she says.  This might be part of why she is gaining weight.  We will try her on some Belsomra (10 mg po q hs) and we will see if this can help.  Last time we saw her, we did a very thorough evaluation for her iron and her weight gain.  Her ferritin was 108 with an iron saturation of 18%.  Her vitamin B12 level was 203.  She did have a low vitamin D level.  Her thyroid was fine.  Her TSH was 2.25.  Her ANA was also negative.  Did a sonogram of her abdomen.  Looks like she had some hepatic cysts.  Is felt that these were compatible with hemangiomas.  However, it was felt that a follow-up ultrasound could be done in 6-12 months.    She and her husband got back from a cruise recently.  They had a very good time.  She is trying to exercise a little bit more.   Overall, her performance status is ECOG 0.  Medications:  Current Outpatient Medications:  .  ergocalciferol (VITAMIN D2) 50000 units capsule, Take 1 capsule (50,000 Units total) once a week by mouth., Disp: 8 capsule, Rfl: 5 .  folic acid (FOLVITE) 1 MG tablet, Take 1 tablet (1 mg total) by mouth daily., Disp: 90 tablet, Rfl: 9 .  gabapentin (NEURONTIN) 300 MG capsule, Take 300 mg by mouth 2 (two) times daily., Disp: , Rfl:  .  hyoscyamine (ANASPAZ) 0.125 MG TBDP disintergrating tablet,  Place 0.125 mg under the tongue every 4 (four) hours as needed for bladder spasms. , Disp: , Rfl: 6 .  hyoscyamine (LEVBID) 0.375 MG 12 hr tablet, Take 1 tablet (0.375 mg total) by mouth at bedtime., Disp: 60 tablet, Rfl: 6 .  ibuprofen (ADVIL,MOTRIN) 800 MG tablet, Take 1 tablet (800 mg total) by mouth every 8 (eight) hours as needed (mild pain)., Disp: 30 tablet, Rfl: 1 .  solifenacin (VESICARE) 10 MG tablet, Take 10 mg by mouth., Disp: , Rfl:   Allergies:  Allergies  Allergen Reactions  . Penicillins Hives and Rash    Has patient had a PCN reaction causing immediate rash, facial/tongue/throat swelling, SOB or lightheadedness with hypotension: Yes Has patient had a PCN reaction causing severe rash involving mucus membranes or skin necrosis: No Has patient had a PCN reaction that required hospitalization No Has patient had a PCN reaction occurring within the last 10 years: No If all of the above answers are "NO", then may proceed with Cephalosporin use.   . Dairy Aid [Lactase] Diarrhea  . Shellfish-Derived Products Hives    Itching    Past Medical History, Surgical history, Social history, and Family Hx: reviewed the past history, etc. Everything is as listed  above in the interim history.  Review of Systems: Review of Systems  Constitutional: Negative.   HENT: Negative.   Eyes: Negative.   Respiratory: Negative.   Cardiovascular: Negative.   Gastrointestinal: Negative.   Genitourinary: Negative.   Musculoskeletal: Negative.   Skin: Negative.   Neurological: Negative.   Endo/Heme/Allergies: Negative.   Psychiatric/Behavioral: Negative.     Physical Exam:  weight is 235 lb 6.4 oz (106.8 kg). Her oral temperature is 98.5 F (36.9 C). Her blood pressure is 100/54 (abnormal) and her pulse is 92. Her oxygen saturation is 100%.   Wt Readings from Last 3 Encounters:  01/07/18 235 lb 6.4 oz (106.8 kg)  07/28/17 217 lb (98.4 kg)  04/30/17 204 lb 1.9 oz (92.6 kg)      Physical  Exam  Constitutional: She is oriented to person, place, and time.  HENT:  Head: Normocephalic and atraumatic.  Mouth/Throat: Oropharynx is clear and moist.  Eyes: Pupils are equal, round, and reactive to light. EOM are normal.  Neck: Normal range of motion.  Cardiovascular: Normal rate, regular rhythm and normal heart sounds.  Pulmonary/Chest: Effort normal and breath sounds normal.  Abdominal: Soft. Bowel sounds are normal.  Musculoskeletal: Normal range of motion. She exhibits no edema, tenderness or deformity.  Lymphadenopathy:    She has no cervical adenopathy.  Neurological: She is alert and oriented to person, place, and time.  Skin: Skin is warm and dry. No rash noted. No erythema.  Psychiatric: She has a normal mood and affect. Her behavior is normal. Judgment and thought content normal.  Vitals reviewed.   Lab Results  Component Value Date   WBC 7.2 01/07/2018   HGB 14.8 10/07/2017   HCT 43.3 01/07/2018   MCV 88.9 01/07/2018   PLT 297 01/07/2018     Chemistry      Component Value Date/Time   NA 134 01/22/2017 1341   NA 137 12/15/2016 1050   K 3.6 01/22/2017 1341   K 4.2 12/15/2016 1050   CL 104 01/22/2017 1341   CO2 23 01/22/2017 1341   CO2 26 12/15/2016 1050   BUN 13 01/22/2017 1341   BUN 11.1 12/15/2016 1050   CREATININE 1.07 (H) 01/22/2017 1341   CREATININE 0.9 12/15/2016 1050      Component Value Date/Time   CALCIUM 9.9 01/22/2017 1341   CALCIUM 9.1 12/15/2016 1050   ALKPHOS 60 01/22/2017 1341   ALKPHOS 66 12/15/2016 1050   AST 11 01/22/2017 1341   AST 15 12/15/2016 1050   ALT 5 01/22/2017 1341   ALT <6 12/15/2016 1050   BILITOT 0.4 01/22/2017 1341   BILITOT 0.45 12/15/2016 1050         Impression and Plan: Nicole Booth is a 42 year old African-American female.  Everything looks pretty good right now.  Her weight is coming down a little bit.  I would not think that her iron level would be low given the MCV.  We will follow her up in about 3  months.  Hopefully, if she sleeps better, she might be able to lose a little weight.  [name], MD 2/1/20193:51 PM

## 2018-05-28 LAB — VITAMIN D 25 HYDROXY (VIT D DEFICIENCY, FRACTURES): Vit D, 25-Hydroxy: 19.9 ng/mL — ABNORMAL LOW (ref 30.0–100.0)

## 2018-05-30 ENCOUNTER — Telehealth: Payer: Self-pay | Admitting: *Deleted

## 2018-05-30 ENCOUNTER — Encounter: Payer: Self-pay | Admitting: *Deleted

## 2018-05-30 DIAGNOSIS — E559 Vitamin D deficiency, unspecified: Secondary | ICD-10-CM

## 2018-05-30 LAB — TSH: TSH: 1.434 u[IU]/mL (ref 0.308–3.960)

## 2018-05-30 LAB — IRON AND TIBC
Iron: 78 ug/dL (ref 41–142)
Saturation Ratios: 27 % (ref 21–57)
TIBC: 293 ug/dL (ref 236–444)
UIBC: 214 ug/dL

## 2018-05-30 LAB — FERRITIN: Ferritin: 170 ng/mL (ref 9–269)

## 2018-05-30 MED ORDER — ERGOCALCIFEROL 1.25 MG (50000 UT) PO CAPS: 50000.0000 [IU] | ORAL_CAPSULE | ORAL | 3 refills | Status: DC

## 2018-05-30 NOTE — Telephone Encounter (Addendum)
Patient is aware of results. Aware of new prescription and pharmacy confirmed.   ----- Message from [name], MD sent at 05/30/2018  6:53 AM EDT ----- Call - Vit D is still low!!!  Take 50000 units 3x per week!! pete

## 2018-06-02 DIAGNOSIS — K080 Exfoliation of teeth due to systemic causes: Secondary | ICD-10-CM | POA: Diagnosis not present

## 2018-08-24 ENCOUNTER — Other Ambulatory Visit: Payer: Self-pay | Admitting: Physician Assistant

## 2018-08-24 DIAGNOSIS — Z1231 Encounter for screening mammogram for malignant neoplasm of breast: Secondary | ICD-10-CM

## 2018-09-09 DIAGNOSIS — R1013 Epigastric pain: Secondary | ICD-10-CM | POA: Diagnosis not present

## 2018-09-09 DIAGNOSIS — Z8619 Personal history of other infectious and parasitic diseases: Secondary | ICD-10-CM | POA: Diagnosis not present

## 2018-09-15 DIAGNOSIS — R1013 Epigastric pain: Secondary | ICD-10-CM | POA: Diagnosis not present

## 2018-09-19 DIAGNOSIS — R1013 Epigastric pain: Secondary | ICD-10-CM | POA: Diagnosis not present

## 2018-09-19 DIAGNOSIS — K080 Exfoliation of teeth due to systemic causes: Secondary | ICD-10-CM | POA: Diagnosis not present

## 2018-09-23 ENCOUNTER — Ambulatory Visit: Payer: Federal, State, Local not specified - PPO

## 2018-10-13 DIAGNOSIS — L700 Acne vulgaris: Secondary | ICD-10-CM | POA: Diagnosis not present

## 2018-10-13 DIAGNOSIS — L910 Hypertrophic scar: Secondary | ICD-10-CM | POA: Diagnosis not present

## 2018-10-13 DIAGNOSIS — L218 Other seborrheic dermatitis: Secondary | ICD-10-CM | POA: Diagnosis not present

## 2018-10-28 ENCOUNTER — Ambulatory Visit: Payer: Federal, State, Local not specified - PPO

## 2018-10-29 DIAGNOSIS — K080 Exfoliation of teeth due to systemic causes: Secondary | ICD-10-CM | POA: Diagnosis not present

## 2018-11-08 DIAGNOSIS — Z01419 Encounter for gynecological examination (general) (routine) without abnormal findings: Secondary | ICD-10-CM | POA: Diagnosis not present

## 2018-11-22 ENCOUNTER — Ambulatory Visit
Admission: RE | Admit: 2018-11-22 | Discharge: 2018-11-22 | Disposition: A | Discharge status: Home / Self Care | Payer: Federal, State, Local not specified - PPO | Source: Ambulatory Visit | Attending: Physician Assistant | Admitting: Physician Assistant

## 2018-11-22 ENCOUNTER — Other Ambulatory Visit: Payer: Self-pay | Admitting: Family Medicine

## 2018-11-22 DIAGNOSIS — Z1231 Encounter for screening mammogram for malignant neoplasm of breast: Secondary | ICD-10-CM

## 2018-11-24 DIAGNOSIS — N393 Stress incontinence (female) (male): Secondary | ICD-10-CM | POA: Diagnosis not present

## 2018-11-24 DIAGNOSIS — Z90710 Acquired absence of both cervix and uterus: Secondary | ICD-10-CM | POA: Diagnosis not present

## 2018-11-24 DIAGNOSIS — Z13220 Encounter for screening for lipoid disorders: Secondary | ICD-10-CM | POA: Diagnosis not present

## 2018-11-24 DIAGNOSIS — D509 Iron deficiency anemia, unspecified: Secondary | ICD-10-CM | POA: Diagnosis not present

## 2018-11-24 DIAGNOSIS — Z0000 Encounter for general adult medical examination without abnormal findings: Secondary | ICD-10-CM | POA: Diagnosis not present

## 2018-11-24 DIAGNOSIS — G43109 Migraine with aura, not intractable, without status migrainosus: Secondary | ICD-10-CM | POA: Diagnosis not present

## 2018-11-24 DIAGNOSIS — K219 Gastro-esophageal reflux disease without esophagitis: Secondary | ICD-10-CM | POA: Diagnosis not present

## 2018-11-24 DIAGNOSIS — E559 Vitamin D deficiency, unspecified: Secondary | ICD-10-CM | POA: Diagnosis not present

## 2018-12-03 DIAGNOSIS — J209 Acute bronchitis, unspecified: Secondary | ICD-10-CM | POA: Diagnosis not present

## 2018-12-05 DIAGNOSIS — Z0000 Encounter for general adult medical examination without abnormal findings: Secondary | ICD-10-CM | POA: Diagnosis not present

## 2018-12-05 DIAGNOSIS — E559 Vitamin D deficiency, unspecified: Secondary | ICD-10-CM | POA: Diagnosis not present

## 2018-12-05 DIAGNOSIS — Z713 Dietary counseling and surveillance: Secondary | ICD-10-CM | POA: Diagnosis not present

## 2018-12-05 DIAGNOSIS — M25561 Pain in right knee: Secondary | ICD-10-CM | POA: Diagnosis not present

## 2018-12-05 DIAGNOSIS — M25562 Pain in left knee: Secondary | ICD-10-CM | POA: Diagnosis not present

## 2018-12-05 DIAGNOSIS — E669 Obesity, unspecified: Secondary | ICD-10-CM | POA: Diagnosis not present

## 2018-12-13 DIAGNOSIS — M222X1 Patellofemoral disorders, right knee: Secondary | ICD-10-CM | POA: Diagnosis not present

## 2018-12-13 DIAGNOSIS — M25562 Pain in left knee: Secondary | ICD-10-CM | POA: Diagnosis not present

## 2018-12-13 DIAGNOSIS — M25561 Pain in right knee: Secondary | ICD-10-CM | POA: Diagnosis not present

## 2018-12-13 DIAGNOSIS — M222X2 Patellofemoral disorders, left knee: Secondary | ICD-10-CM | POA: Diagnosis not present

## 2018-12-30 DIAGNOSIS — E669 Obesity, unspecified: Secondary | ICD-10-CM | POA: Diagnosis not present

## 2018-12-30 DIAGNOSIS — Z2821 Immunization not carried out because of patient refusal: Secondary | ICD-10-CM | POA: Diagnosis not present

## 2019-01-03 ENCOUNTER — Ambulatory Visit: Payer: Federal, State, Local not specified - PPO | Admitting: Allergy and Immunology

## 2019-01-03 ENCOUNTER — Encounter: Payer: Self-pay | Admitting: Allergy and Immunology

## 2019-01-03 VITALS — BP 108/88 | HR 63 | Temp 98.4°F | Resp 18 | Ht 63.0 in | Wt 213.0 lb

## 2019-01-03 DIAGNOSIS — H1013 Acute atopic conjunctivitis, bilateral: Secondary | ICD-10-CM

## 2019-01-03 DIAGNOSIS — J4530 Mild persistent asthma, uncomplicated: Secondary | ICD-10-CM

## 2019-01-03 DIAGNOSIS — Z91018 Allergy to other foods: Secondary | ICD-10-CM

## 2019-01-03 DIAGNOSIS — T7840XD Allergy, unspecified, subsequent encounter: Secondary | ICD-10-CM

## 2019-01-03 DIAGNOSIS — L500 Allergic urticaria: Secondary | ICD-10-CM | POA: Diagnosis not present

## 2019-01-03 DIAGNOSIS — J3089 Other allergic rhinitis: Secondary | ICD-10-CM | POA: Diagnosis not present

## 2019-01-03 DIAGNOSIS — H1010 Acute atopic conjunctivitis, unspecified eye: Secondary | ICD-10-CM | POA: Insufficient documentation

## 2019-01-03 MED ORDER — FLUOCINOLONE ACETONIDE 0.01 % OT OIL: 5.0000 [drp] | TOPICAL_OIL | Freq: Two times a day (BID) | OTIC | 0 refills | Status: DC | PRN

## 2019-01-03 MED ORDER — MONTELUKAST SODIUM 10 MG PO TABS: 10.0000 mg | ORAL_TABLET | Freq: Every day | ORAL | 5 refills | Status: DC

## 2019-01-03 MED ORDER — OLOPATADINE HCL 0.7 % OP SOLN: 1.0000 [drp] | Freq: Every day | OPHTHALMIC | 5 refills | Status: DC | PRN

## 2019-01-03 MED ORDER — EPINEPHRINE 0.3 MG/0.3ML IJ SOAJ: 0.3000 mg | INTRAMUSCULAR | 1 refills | Status: DC | PRN

## 2019-01-03 MED ORDER — CARBINOXAMINE MALEATE 4 MG PO TABS: 4.0000 mg | ORAL_TABLET | Freq: Four times a day (QID) | ORAL | 5 refills | Status: AC | PRN

## 2019-01-03 MED ORDER — FLUTICASONE PROPIONATE 93 MCG/ACT NA EXHU: 2.0000 | INHALANT_SUSPENSION | Freq: Two times a day (BID) | NASAL | 5 refills | Status: AC

## 2019-01-03 NOTE — Assessment & Plan Note (Addendum)
The patients history suggests shellfish allergy, though todays skin tests were negative despite a positive histamine control.  Food allergen skin testing has excellent negative predictive value however there is still a 5% chance that the allergy exists.  Therefore, we will investigate further with serum specific IgE levels and, if negative, open graded oral challenge.  The following labs have been ordered: FCeRI antibody, anti-thyroglobulin antibody, thyroid peroxidase antibody, tryptase, CBC, CMP, ESR, ANA, and serum specific IgE level against shellfish panel and alpha gal panel.  A prescription has been provided for epinephrine 0.3 mg autoinjector 2 pack along with instructions for its proper administration.  The patient will be called with further recommendations after lab results have returned.  Until the food allergy has been definitively ruled out, the patient is to continue meticulous avoidance and have access to epinephrine autoinjector 2 pack.  Should symptoms recur, a careful symptom exposure journal is to be kept. 

## 2019-01-03 NOTE — Progress Notes (Signed)
New Patient Note  RE: Nicole Booth MRN: [id number] DOB: 09-08-1976 Date of Office Visit: 01/03/2019  Referring provider: No ref. provider found Primary care provider: Jonathon Bellows, PA-C  Chief Complaint: Wheezing; Sinus Problem; and Urticaria   History of present illness: Nicole Booth is a 43 y.o. female presenting today for evaluation of possible food allergy, bronchitis, and rhinits. She complains of nasal congestion, rhinorrhea, sneezing, postnasal drainage, nasal pruritus, ocular pruritus, ear canal pruritus, and occasional sinus pressure.  These symptoms occur year around but are more frequent and severe with pollen exposure, particularly during the springtime.  She also complains of a persistent cough which occurs every year during March and April.  She states that at work she is "infamous for the cough."  The cough is described as nonproductive/hacking, worse at nighttime, and lingers after upper respiratory tract infections.  She reports that she experienced coughing as well as wheezing this past December while she had a sinus infection.  She was given a prescription for albuterol and experienced significant temporary relief of coughing and wheezing with the albuterol. Over the past 5 years, Nicole Booth, has experienced several episodes of hives.  The hives are described as red, raised, and pruritic and typically only involve the face.  She does not experience concomitant angioedema, cardiopulmonary symptoms, or GI symptoms.  She is uncertain, however believes that the hives may be associated with the consumption of crab.  Otherwise, no specific medication, food, skin care product, detergent, soap, or other environmental triggers have been identified.  When the hives occur, she takes Benadryl and applies hydrocortisone cream and the hives typically occur within a few hours without additional intervention.  She reports that if she eats shrimp she does not develop  urticaria but experiences nausea, vomiting, and diarrhea several hours after the meal.  She currently does not have an epinephrine autoinjector.  Assessment and plan: Perennial and seasonal allergic rhinitis  Aeroallergen avoidance measures have been discussed and provided in written form.  A prescription has been provided for carbinoxamine 4 mg every 4-6 hours as needed.  A prescription has been provided for [facility], 2 actuations per nostril twice a day. Proper technique has been discussed and demonstrated.  Nasal saline lavage has been recommended as needed and prior to medicated nasal sprays along with instructions for proper administration.  Allergic conjunctivitis  Treatment plan as outlined above for allergic rhinitis.  A prescription has been provided for Pazeo, one drop per eye daily as needed.  I have also recommended eye lubricant drops (i.e., Natural Tears) as needed.  Mild persistent asthma Todays spirometry results, assessed while asymptomatic, suggest under-perception of bronchoconstriction.  A prescription has been provided for montelukast 10 mg daily at bedtime.  Continue albuterol HFA, 1 to 2 inhalations every 4-6 hours as needed.   Subjective and objective measures of pulmonary function will be followed and the treatment plan will be adjusted accordingly.  Allergic urticaria/history of food allergy The patients history suggests shellfish allergy, though todays skin tests were negative despite a positive histamine control.  Food allergen skin testing has excellent negative predictive value however there is still a 5% chance that the allergy exists.  Therefore, we will investigate further with serum specific IgE levels and, if negative, open graded oral challenge.  The following labs have been ordered: FCeRI antibody, anti-thyroglobulin antibody, thyroid peroxidase antibody, tryptase, CBC, CMP, ESR, ANA, and serum specific IgE level against shellfish panel and alpha  gal panel.  A prescription has  been provided for epinephrine 0.3 mg autoinjector 2 pack along with instructions for its proper administration.  The patient will be called with further recommendations after lab results have returned.  Until the food allergy has been definitively ruled out, the patient is to continue meticulous avoidance and have access to epinephrine autoinjector 2 pack.  Should symptoms recur, a careful symptom exposure journal is to be kept.   Meds ordered this encounter  Medications  . Carbinoxamine Maleate 4 MG TABS    Sig: Take 1 tablet (4 mg total) by mouth every 6 (six) hours as needed.    Dispense:  28 each    Refill:  5  . Fluticasone Propionate (XHANCE) 93 MCG/ACT EXHU    Sig: Place 2 sprays into the nose 2 (two) times daily.    Dispense:  32 mL    Refill:  5    [phone] (M)  . Olopatadine HCl (PAZEO) 0.7 % SOLN    Sig: Place 1 drop into both eyes daily as needed.    Dispense:  1 Bottle    Refill:  5  . montelukast (SINGULAIR) 10 MG tablet    Sig: Take 1 tablet (10 mg total) by mouth at bedtime.    Dispense:  30 tablet    Refill:  5  . EPINEPHrine (AUVI-Q) 0.3 mg/0.3 mL IJ SOAJ injection    Sig: Inject 0.3 mLs (0.3 mg total) into the muscle as needed for anaphylaxis.    Dispense:  2 Device    Refill:  1    [phone] (M)  . Fluocinolone Acetonide 0.01 % OIL    Sig: Place 5 drops in ear(s) 2 (two) times daily as needed (2 times per day as needed x 14 days).    Dispense:  1 Bottle    Refill:  0    Diagnostics: Spirometry: Spirometry reveals an FVC of 3.27 L and an FEV1 of 2.60 L with significant (320 mL, 12%) postbronchodilator improvement.  This study was performed while the patient was asymptomatic.  Please see scanned spirometry results for details. Environmental skin testing: Positive to grass pollen, molds, and dust mite antigen. Food allergen skin testing: Negative despite a positive histamine control.     Physical examination: Blood  pressure 108/88, pulse 63, temperature 98.4 F (36.9 C), temperature source Oral, resp. rate 18, height '5\' 3"'  (1.6 m), weight 213 lb (96.6 kg), last menstrual period 07/10/2016, SpO2 98 %.  General: Alert, interactive, in no acute distress. HEENT: TMs pearly gray, turbinates moderately edematous without discharge, post-pharynx erythematous. Neck: Supple without lymphadenopathy. Lungs: Clear to auscultation without wheezing, rhonchi or rales. CV: Normal S1, S2 without murmurs. Abdomen: Nondistended, nontender. Skin: Warm and dry, without lesions or rashes. Extremities:  No clubbing, cyanosis or edema. Neuro:   Grossly intact.  Review of systems:  Review of systems negative except as noted in HPI / PMHx or noted below: Review of Systems  Constitutional: Negative.   HENT: Negative.   Eyes: Negative.   Respiratory: Negative.   Cardiovascular: Negative.   Gastrointestinal: Negative.   Genitourinary: Negative.   Musculoskeletal: Negative.   Skin: Negative.   Neurological: Negative.   Endo/Heme/Allergies: Negative.   Psychiatric/Behavioral: Negative.     Past medical history:  Past Medical History:  Diagnosis Date  . Anxiety   . Arthritis    hands and knees  . Bilateral knee pain   . Carpal tunnel syndrome   . Epigastric pain   . GERD (gastroesophageal reflux disease)   . Goiter   .  H/O umbilical hernia repair   . Hyperplastic colon polyp   . IBS (irritable bowel syndrome)   . Intraocular pressure increase   . Iron deficiency anemia   . Iron malabsorption 05/30/2015  . Joint pain   . Labyrinthitis   . Lactose intolerance   . Livedo reticularis   . Menometrorrhagia 05/30/2015  . Migraine headache   . Obesity   . Pes planus   . PONV (postoperative nausea and vomiting)   . SOB (shortness of breath)   . Vitamin D deficiency     Past surgical history:  Past Surgical History:  Procedure Laterality Date  . CESAREAN SECTION    . COLONOSCOPY    . LAPAROSCOPY    .  ROBOTIC ASSISTED TOTAL HYSTERECTOMY WITH SALPINGECTOMY Bilateral 08/19/2016   Procedure: ROBOTIC ASSISTED TOTAL HYSTERECTOMY WITH SALPINGECTOMY;  Surgeon: Christophe Louis, MD;  Location: [facility];  Service: Gynecology;  Laterality: Bilateral;  . UPPER GI ENDOSCOPY      Family history: Family History  Problem Relation Age of Onset  . Fibromyalgia Mother   . Hypertension Father   . Diabetes Father   . Cancer Maternal Aunt   . Breast cancer Paternal Aunt     Social history: Social History   Socioeconomic History  . Marital status: Married    Spouse name: Not on file  . Number of children: Not on file  . Years of education: Not on file  . Highest education level: Not on file  Occupational History  . Not on file  Social Needs  . Financial resource strain: Not on file  . Food insecurity:    Worry: Not on file    Inability: Not on file  . Transportation needs:    Medical: Not on file    Non-medical: Not on file  Tobacco Use  . Smoking status: Never Smoker  . Smokeless tobacco: Never Used  Substance and Sexual Activity  . Alcohol use: No    Alcohol/week: 0.0 standard drinks  . Drug use: No  . Sexual activity: Yes  Lifestyle  . Physical activity:    Days per week: Not on file    Minutes per session: Not on file  . Stress: Not on file  Relationships  . Social connections:    Talks on phone: Not on file    Gets together: Not on file    Attends religious service: Not on file    Active member of club or organization: Not on file    Attends meetings of clubs or organizations: Not on file    Relationship status: Not on file  . Intimate partner violence:    Fear of current or ex partner: Not on file    Emotionally abused: Not on file    Physically abused: Not on file    Forced sexual activity: Not on file  Other Topics Concern  . Not on file  Social History Narrative  . Not on file   Environmental History: The patient lives in a house built in [place] with carpeting throughout,  gas heat, and central air.  She is a non-smoker without pets.  Allergies as of 01/03/2019      Reactions   Penicillins Hives, Rash, Other (See Comments)   Has patient had a PCN reaction causing immediate rash, facial/tongue/throat swelling, SOB or lightheadedness with hypotension: Yes Has patient had a PCN reaction causing severe rash involving mucus membranes or skin necrosis: No Has patient had a PCN reaction that required hospitalization No Has patient  had a PCN reaction occurring within the last 10 years: No If all of the above answers are "NO", then may proceed with Cephalosporin use.   Lactase Diarrhea, Other (See Comments)   Chief Strategy Officer) IBS w/Diarrhea- upsets stomach Chief Strategy Officer) IBS w/Diarrhea- upsets stomach   Shellfish Allergy Itching   Shellfish-derived Products Hives   Itching   Latex Itching   Patient states that she does not need a test. Not really an allergy. Has had some itching with gloves but has had surgery before without issues Patient states that she does not need a test. Not really an allergy. Has had some itching with gloves but has had surgery before without issues      Medication List       Accurate as of January 03, 2019  9:15 PM. Always use your most recent med list.        albuterol 108 (90 Base) MCG/ACT inhaler Commonly known as:  PROVENTIL HFA;VENTOLIN HFA   BELVIQ XR 20 MG Tb24 Generic drug:  Lorcaserin HCl ER   Carbinoxamine Maleate 4 MG Tabs Take 1 tablet (4 mg total) by mouth every 6 (six) hours as needed.   EPINEPHrine 0.3 mg/0.3 mL Soaj injection Commonly known as:  AUVI-Q Inject 0.3 mLs (0.3 mg total) into the muscle as needed for anaphylaxis.   ergocalciferol 1.25 MG (50000 UT) capsule Commonly known as:  VITAMIN D2 Take 1 capsule (50,000 Units total) by mouth 3 (three) times a week.   Fluocinolone Acetonide 0.01 % Oil Place 5 drops in ear(s) 2 (two) times daily as needed (2 times per day as needed x 14 days).   Fluticasone  Propionate 93 MCG/ACT Exhu Commonly known as:  XHANCE Place 2 sprays into the nose 2 (two) times daily.   folic acid 1 MG tablet Commonly known as:  FOLVITE Take 1 tablet (1 mg total) by mouth daily.   gabapentin 300 MG capsule Commonly known as:  NEURONTIN Take 300 mg by mouth 2 (two) times daily.   hyoscyamine 0.375 MG 12 hr tablet Commonly known as:  LEVBID Take 1 tablet (0.375 mg total) by mouth at bedtime.   hyoscyamine 0.125 MG Tbdp disintergrating tablet Commonly known as:  ANASPAZ Place 1 tablet (0.125 mg total) under the tongue every 4 (four) hours as needed for bladder spasms.   ibuprofen 800 MG tablet Commonly known as:  ADVIL,MOTRIN Take 1 tablet (800 mg total) by mouth every 8 (eight) hours as needed (mild pain).   ipratropium 0.03 % nasal spray Commonly known as:  ATROVENT   montelukast 10 MG tablet Commonly known as:  SINGULAIR Take 1 tablet (10 mg total) by mouth at bedtime.   Olopatadine HCl 0.7 % Soln Commonly known as:  PAZEO Place 1 drop into both eyes daily as needed.   oxybutynin 5 MG 24 hr tablet Commonly known as:  DITROPAN-XL   topiramate 100 MG tablet Commonly known as:  TOPAMAX Take by mouth.   valACYclovir 500 MG tablet Commonly known as:  VALTREX       Known medication allergies: Allergies  Allergen Reactions  . Penicillins Hives, Rash and Other (See Comments)    Has patient had a PCN reaction causing immediate rash, facial/tongue/throat swelling, SOB or lightheadedness with hypotension: Yes Has patient had a PCN reaction causing severe rash involving mucus membranes or skin necrosis: No Has patient had a PCN reaction that required hospitalization No Has patient had a PCN reaction occurring within the last 10 years: No If all  of the above answers are "NO", then may proceed with Cephalosporin use.   . Lactase Diarrhea and Other (See Comments)    (Dairy Products) IBS w/Diarrhea- upsets stomach Chief Strategy Officer) IBS w/Diarrhea-  upsets stomach   . Shellfish Allergy Itching  . Shellfish-Derived Products Hives    Itching  . Latex Itching    Patient states that she does not need a test. Not really an allergy. Has had some itching with gloves but has had surgery before without issues Patient states that she does not need a test. Not really an allergy. Has had some itching with gloves but has had surgery before without issues     I appreciate the opportunity to take part in Nicole Booth's care. Please do not hesitate to contact me with questions.  Sincerely,   R. Edgar Frisk, MD

## 2019-01-03 NOTE — Assessment & Plan Note (Signed)
   Aeroallergen avoidance measures have been discussed and provided in written form.  A prescription has been provided for carbinoxamine 4 mg every 4-6 hours as needed.  A prescription has been provided for [facility], 2 actuations per nostril twice a day. Proper technique has been discussed and demonstrated.  Nasal saline lavage has been recommended as needed and prior to medicated nasal sprays along with instructions for proper administration.

## 2019-01-03 NOTE — Assessment & Plan Note (Signed)
   Treatment plan as outlined above for allergic rhinitis.  A prescription has been provided for Pazeo, one drop per eye daily as needed.  I have also recommended eye lubricant drops (i.e., Natural Tears) as needed. 

## 2019-01-03 NOTE — Progress Notes (Signed)
llespi

## 2019-01-03 NOTE — Patient Instructions (Signed)
Perennial and seasonal allergic rhinitis  Aeroallergen avoidance measures have been discussed and provided in written form.  A prescription has been provided for carbinoxamine 4 mg every 4-6 hours as needed.  A prescription has been provided for [facility], 2 actuations per nostril twice a day. Proper technique has been discussed and demonstrated.  Nasal saline lavage has been recommended as needed and prior to medicated nasal sprays along with instructions for proper administration.  Allergic conjunctivitis  Treatment plan as outlined above for allergic rhinitis.  A prescription has been provided for Pazeo, one drop per eye daily as needed.  I have also recommended eye lubricant drops (i.e., Natural Tears) as needed.  Mild persistent asthma Todays spirometry results, assessed while asymptomatic, suggest under-perception of bronchoconstriction.  A prescription has been provided for montelukast 10 mg daily at bedtime.  Continue albuterol HFA, 1 to 2 inhalations every 4-6 hours as needed.   Subjective and objective measures of pulmonary function will be followed and the treatment plan will be adjusted accordingly.  Allergic urticaria/history of food allergy The patients history suggests shellfish allergy, though todays skin tests were negative despite a positive histamine control.  Food allergen skin testing has excellent negative predictive value however there is still a 5% chance that the allergy exists.  Therefore, we will investigate further with serum specific IgE levels and, if negative, open graded oral challenge.  The following labs have been ordered: FCeRI antibody, anti-thyroglobulin antibody, thyroid peroxidase antibody, tryptase, CBC, CMP, ESR, ANA, and serum specific IgE level against shellfish panel and alpha gal panel.  A prescription has been provided for epinephrine 0.3 mg autoinjector 2 pack along with instructions for its proper administration.  The patient will be  called with further recommendations after lab results have returned.  Until the food allergy has been definitively ruled out, the patient is to continue meticulous avoidance and have access to epinephrine autoinjector 2 pack.  Should symptoms recur, a careful symptom exposure journal is to be kept.   Return in about 3 months (around 04/04/2019), or if symptoms worsen or fail to improve.  Control of House Dust Mite Allergen  House dust mites play a major role in allergic asthma and rhinitis.  They occur in environments with high humidity wherever human skin, the food for dust mites is found. High levels have been detected in dust obtained from mattresses, pillows, carpets, upholstered furniture, bed covers, clothes and soft toys.  The principal allergen of the house dust mite is found in its feces.  A gram of dust may contain 1,000 mites and 250,000 fecal particles.  Mite antigen is easily measured in the air during house cleaning activities.    1. Encase mattresses, including the box spring, and pillow, in an air tight cover.  Seal the zipper end of the encased mattresses with wide adhesive tape. 2. Wash the bedding in water of 130 degrees Farenheit weekly.  Avoid cotton comforters/quilts and flannel bedding: the most ideal bed covering is the dacron comforter. 3. Remove all upholstered furniture from the bedroom. 4. Remove carpets, carpet padding, rugs, and non-washable window drapes from the bedroom.  Wash drapes weekly or use plastic window coverings. 5. Remove all non-washable stuffed toys from the bedroom.  Wash stuffed toys weekly. 6. Have the room cleaned frequently with a vacuum cleaner and a damp dust-mop.  The patient should not be in a room which is being cleaned and should wait 1 hour after cleaning before going into the room. 7. Close and seal  all heating outlets in the bedroom.  Otherwise, the room will become filled with dust-laden air.  An electric heater can be used to heat the  room. 8. Reduce indoor humidity to less than 50%.  Do not use a humidifier.  Reducing Pollen Exposure  The American Academy of Allergy, Asthma and Immunology suggests the following steps to reduce your exposure to pollen during allergy seasons.    1. Do not hang sheets or clothing out to dry; pollen may collect on these items. 2. Do not mow lawns or spend time around freshly cut grass; mowing stirs up pollen. 3. Keep windows closed at night.  Keep car windows closed while driving. 4. Minimize morning activities outdoors, a time when pollen counts are usually at their highest. 5. Stay indoors as much as possible when pollen counts or humidity is high and on windy days when pollen tends to remain in the air longer. 6. Use air conditioning when possible.  Many air conditioners have filters that trap the pollen spores. 7. Use a HEPA room air filter to remove pollen form the indoor air you breathe.   Control of Mold Allergen  Mold and fungi can grow on a variety of surfaces provided certain temperature and moisture conditions exist.  Outdoor molds grow on plants, decaying vegetation and soil.  The major outdoor mold, Alternaria and Cladosporium, are found in very high numbers during hot and dry conditions.  Generally, a late Summer - Fall peak is seen for common outdoor fungal spores.  Rain will temporarily lower outdoor mold spore count, but counts rise rapidly when the rainy period ends.  The most important indoor molds are Aspergillus and Penicillium.  Dark, humid and poorly ventilated basements are ideal sites for mold growth.  The next most common sites of mold growth are the bathroom and the kitchen.  Outdoor [employer] 1. Use air conditioning and keep windows closed 2. Avoid exposure to decaying vegetation. 3. Avoid leaf raking. 4. Avoid grain handling. 5. Consider wearing a face mask if working in moldy areas.  Indoor Mold Control 1. Maintain humidity below 50%. 2. Clean washable  surfaces with 5% bleach solution. 3. Remove sources e.g. Contaminated carpets.

## 2019-01-03 NOTE — Assessment & Plan Note (Signed)
Todays spirometry results, assessed while asymptomatic, suggest under-perception of bronchoconstriction.  A prescription has been provided for montelukast 10 mg daily at bedtime.  Continue albuterol HFA, 1 to 2 inhalations every 4-6 hours as needed.   Subjective and objective measures of pulmonary function will be followed and the treatment plan will be adjusted accordingly.

## 2019-01-23 DIAGNOSIS — L500 Allergic urticaria: Secondary | ICD-10-CM | POA: Diagnosis not present

## 2019-01-23 DIAGNOSIS — Z91018 Allergy to other foods: Secondary | ICD-10-CM | POA: Diagnosis not present

## 2019-01-23 DIAGNOSIS — T7840XD Allergy, unspecified, subsequent encounter: Secondary | ICD-10-CM | POA: Diagnosis not present

## 2019-01-27 LAB — COMPREHENSIVE METABOLIC PANEL
ALT: 8 IU/L (ref 0–32)
AST: 14 IU/L (ref 0–40)
Albumin/Globulin Ratio: 1.8 (ref 1.2–2.2)
Albumin: 4.6 g/dL (ref 3.8–4.8)
Alkaline Phosphatase: 63 IU/L (ref 39–117)
BUN/Creatinine Ratio: 10 (ref 9–23)
BUN: 10 mg/dL (ref 6–24)
Bilirubin Total: 0.5 mg/dL (ref 0.0–1.2)
CO2: 18 mmol/L — ABNORMAL LOW (ref 20–29)
Calcium: 9.4 mg/dL (ref 8.7–10.2)
Chloride: 109 mmol/L — ABNORMAL HIGH (ref 96–106)
Creatinine, Ser: 1.05 mg/dL — ABNORMAL HIGH (ref 0.57–1.00)
GFR calc Af Amer: 75 mL/min/{1.73_m2} (ref >59)
GFR calc non Af Amer: 65 mL/min/{1.73_m2} (ref >59)
Globulin, Total: 2.6 g/dL (ref 1.5–4.5)
Glucose: 114 mg/dL — ABNORMAL HIGH (ref 65–99)
Potassium: 3.5 mmol/L (ref 3.5–5.2)
Sodium: 142 mmol/L (ref 134–144)
Total Protein: 7.2 g/dL (ref 6.0–8.5)

## 2019-01-27 LAB — THYROID ANTIBODIES
Thyroglobulin Antibody: <1 IU/mL (ref 0.0–0.9)
Thyroperoxidase Ab SerPl-aCnc: 10 IU/mL (ref 0–34)

## 2019-01-27 LAB — ALPHA-GAL PANEL
Alpha Gal IgE*: <0.1 kU/L (ref <0.10)
Beef (Bos spp) IgE: <0.1 kU/L (ref <0.35)
Class Interpretation: 0
Class Interpretation: 0
Class Interpretation: 0
Lamb/Mutton (Ovis spp) IgE: <0.1 kU/L (ref <0.35)
Pork (Sus spp) IgE: <0.1 kU/L (ref <0.35)

## 2019-01-27 LAB — CBC WITH DIFFERENTIAL/PLATELET
Basophils Absolute: 0 10*3/uL (ref 0.0–0.2)
Basos: 1 %
EOS (ABSOLUTE): 0.1 10*3/uL (ref 0.0–0.4)
Eos: 2 %
Hematocrit: 46.9 % — ABNORMAL HIGH (ref 34.0–46.6)
Hemoglobin: 15.4 g/dL (ref 11.1–15.9)
Immature Grans (Abs): 0 10*3/uL (ref 0.0–0.1)
Immature Granulocytes: 0 %
Lymphocytes Absolute: 1.2 10*3/uL (ref 0.7–3.1)
Lymphs: 25 %
MCH: 28.5 pg (ref 26.6–33.0)
MCHC: 32.8 g/dL (ref 31.5–35.7)
MCV: 87 fL (ref 79–97)
Monocytes Absolute: 0.2 10*3/uL (ref 0.1–0.9)
Monocytes: 5 %
Neutrophils Absolute: 3.3 10*3/uL (ref 1.4–7.0)
Neutrophils: 67 %
Platelets: 284 10*3/uL (ref 150–450)
RBC: 5.41 x10E6/uL — ABNORMAL HIGH (ref 3.77–5.28)
RDW: 13 % (ref 11.7–15.4)
WBC: 4.9 10*3/uL (ref 3.4–10.8)

## 2019-01-27 LAB — TRYPTASE: Tryptase: 4.8 ug/L (ref 2.2–13.2)

## 2019-01-27 LAB — ANA: ANA Titer 1: NEGATIVE

## 2019-01-27 LAB — CHRONIC URTICARIA: cu index: <1.5 (ref <10)

## 2019-01-27 LAB — ALLERGEN PROFILE, SHELLFISH
Clam IgE: <0.1 kU/L
F023-IgE Crab: <0.1 kU/L
F080-IgE Lobster: <0.1 kU/L
F290-IgE Oyster: <0.1 kU/L
Scallop IgE: <0.1 kU/L
Shrimp IgE: <0.1 kU/L

## 2019-01-27 LAB — SEDIMENTATION RATE: Sed Rate: 23 mm/hr (ref 0–32)

## 2019-03-03 ENCOUNTER — Telehealth: Payer: Self-pay | Admitting: Allergy and Immunology

## 2019-03-03 NOTE — Telephone Encounter (Signed)
Please advise on letter for her asthma

## 2019-03-03 NOTE — Telephone Encounter (Signed)
Patient needs a letter to give to her [employer] office stating she has a diagnosis of asthma, and treatments she gets, any limitations she may have and any other information.

## 2019-03-06 NOTE — Telephone Encounter (Signed)
Call to patient, letter is ready

## 2019-03-06 NOTE — Telephone Encounter (Signed)
Please print the following on letterhead then bring it to me and I will sign it.  To whom it may concern:  Phillip Heal is a patient of mine at the [facility].  She has persistent asthma treated with montelukast 10 mg daily and albuterol HFA, 1 to 2 inhalations every 4-6 hours as needed.  Having underlying pulmonary disease puts her at higher risk for complications of [id number].  As such, please take this into consideration with regards to her work accommodations.  Please do not hesitate to call with questions.  Sincerely,    R.  Edgar Frisk, MD

## 2019-03-06 NOTE — Telephone Encounter (Signed)
Pt called back asking about her allergies.  Pt states that she went to the pharmacy for her eye drops, they were over $200 dollars, she was unable to get prescription.  Informed pt that she could get any antihistamine eye drops OTC.  Also, she never received her prescription for [facility], gave her the phone number to call to find out what happened to her prescription.  Also, she was asking about what else could she do with her allergies.  Referred back to Dr Mariane Masters last visit note and gave her his instructions.  Call lasted 20 minutes.  Will mail letter to patients home address.

## 2019-05-24 DIAGNOSIS — R61 Generalized hyperhidrosis: Secondary | ICD-10-CM | POA: Diagnosis not present

## 2019-05-24 DIAGNOSIS — M791 Myalgia, unspecified site: Secondary | ICD-10-CM | POA: Diagnosis not present

## 2019-05-24 DIAGNOSIS — F419 Anxiety disorder, unspecified: Secondary | ICD-10-CM | POA: Diagnosis not present

## 2019-05-24 DIAGNOSIS — R0683 Snoring: Secondary | ICD-10-CM | POA: Diagnosis not present

## 2019-06-27 ENCOUNTER — Telehealth: Payer: Self-pay | Admitting: *Deleted

## 2019-06-27 ENCOUNTER — Encounter: Payer: Self-pay | Admitting: Allergy and Immunology

## 2019-06-27 ENCOUNTER — Other Ambulatory Visit: Payer: Self-pay

## 2019-06-27 ENCOUNTER — Ambulatory Visit: Payer: Federal, State, Local not specified - PPO | Admitting: Allergy and Immunology

## 2019-06-27 VITALS — BP 114/70 | HR 64 | Temp 98.6°F | Resp 16 | Wt 192.8 lb

## 2019-06-27 DIAGNOSIS — J4530 Mild persistent asthma, uncomplicated: Secondary | ICD-10-CM

## 2019-06-27 DIAGNOSIS — J3089 Other allergic rhinitis: Secondary | ICD-10-CM | POA: Diagnosis not present

## 2019-06-27 DIAGNOSIS — R442 Other hallucinations: Secondary | ICD-10-CM | POA: Diagnosis not present

## 2019-06-27 MED ORDER — FLOVENT HFA 110 MCG/ACT IN AERO: 2.0000 | INHALATION_SPRAY | Freq: Two times a day (BID) | RESPIRATORY_TRACT | 5 refills | Status: DC

## 2019-06-27 MED ORDER — MONTELUKAST SODIUM 10 MG PO TABS: 10.0000 mg | ORAL_TABLET | Freq: Every day | ORAL | 5 refills | Status: AC

## 2019-06-27 NOTE — Assessment & Plan Note (Signed)
   A prescription has been provided for Flovent (fluticasone) 110 g, 2 inhalations twice a day. To maximize pulmonary deposition, a spacer has been provided along with instructions for its proper administration with an HFA inhaler.  For now, continue montelukast 10 mg daily at bedtime and albuterol HFA, 1 to 2 inhalations every 4-6 hours if needed.  Subjective and objective measures of pulmonary function will be followed and the treatment plan will be adjusted accordingly.

## 2019-06-27 NOTE — Telephone Encounter (Signed)
-----   Message from Adelina Mings, MD sent at 06/27/2019  3:30 PM EDT -----  Please print out the following letter on letterhead: >>>>>>>>>>>>>>>>>>>>>>>   June 27, 2019  To whom it may concern:Nicole Booth is a patient of mine with moderate persistent asthma.  Given her difficulty breathing with a mask on her face as well as the risk of severe complications associated with COVID-19 infection, I recommend that she work from home if possible so as to diminish the risk of COVID-19 exposure and eliminate the need to wear a mask while working.  Thank you for your consideration in this matter. Please do not hesitate to contact me with questions or concerns.  Sincerely, R.  Edgar Frisk, MD

## 2019-06-27 NOTE — Progress Notes (Signed)
Follow-up Note  RE: Nicole Booth MRN: [id number] DOB: 1976-01-22 Date of Office Visit: 06/27/2019  Primary care provider: Jonathon Bellows, PA-C Referring provider: Keane Scrape*  History of present illness: Nicole Booth is a 43 y.o. female with persistent asthma, allergic rhinoconjunctivitis, and history of urticaria/food allergy presenting today for follow-up.  She was previously seen in this clinic for initial evaluation in January 2020.  She reports that in December 2019 she had a flulike illness including fever, coughing, dyspnea, and chest pain.  She reports that since that time, she has experienced persistent thick postnasal drainage which is "constant every day."  She admits that she has not been using nasal saline irrigation and rarely uses Xhance.  She complains that over the past 2 months she has experienced episodes of smelling smoke when there is no smoke around.  Others around her do not smell smoke or anything unusual.  She reports that at times she is awakened from sleep because of the scent of the smoke and "it is taking my breath."  Assessment and plan: Phantosmia Unclear etiology.  Nasal saline lavage (NeilMed) has been recommended with monitoring for symptom improvement.  I have recommended further evaluation by otolaryngology, Dr. Benjamine Mola.  Mild persistent asthma  A prescription has been provided for Flovent (fluticasone) 110 g, 2 inhalations twice a day. To maximize pulmonary deposition, a spacer has been provided along with instructions for its proper administration with an HFA inhaler.  For now, continue montelukast 10 mg daily at bedtime and albuterol HFA, 1 to 2 inhalations every 4-6 hours if needed.  Subjective and objective measures of pulmonary function will be followed and the treatment plan will be adjusted accordingly.  Perennial and seasonal allergic rhinitis  Continue appropriate allergen avoidance measures,  carbinoxamine as needed, and Xhance nasal spray as needed.  Nasal saline lavage (NeilMed) has been recommended as needed and prior to medicated nasal sprays along with instructions for proper administration.  For thick post nasal drainage, add guaifenesin 1200 mg (Mucinex Maximum Strength)  twice daily as needed with adequate hydration as discussed.   Meds ordered this encounter  Medications  . montelukast (SINGULAIR) 10 MG tablet    Sig: Take 1 tablet (10 mg total) by mouth at bedtime.    Dispense:  30 tablet    Refill:  5  . fluticasone (FLOVENT HFA) 110 MCG/ACT inhaler    Sig: Inhale 2 puffs into the lungs 2 (two) times daily.    Dispense:  1 Inhaler    Refill:  5    Diagnostics: Spirometry:  Normal with an FEV1 of 103% predicted. This study was performed while the patient was asymptomatic.  Please see scanned spirometry results for details.    Physical examination: Blood pressure 114/70, pulse 64, temperature 98.6 F (37 C), resp. rate 16, weight 192 lb 12.8 oz (87.5 kg), last menstrual period 07/10/2016, SpO2 98 %.  General: Alert, interactive, in no acute distress. HEENT: TMs pearly gray, turbinates mildly edematous without discharge, post-pharynx unremarkable. Neck: Supple without lymphadenopathy. Lungs: Clear to auscultation without wheezing, rhonchi or rales. CV: Normal S1, S2 without murmurs. Skin: Warm and dry, without lesions or rashes.  The following portions of the patient's history were reviewed and updated as appropriate: allergies, current medications, past family history, past medical history, past social history, past surgical history and problem list.  Allergies as of 06/27/2019      Reactions   Penicillins Hives, Rash, Other (See Comments)   Has  patient had a PCN reaction causing immediate rash, facial/tongue/throat swelling, SOB or lightheadedness with hypotension: Yes Has patient had a PCN reaction causing severe rash involving mucus membranes or skin  necrosis: No Has patient had a PCN reaction that required hospitalization No Has patient had a PCN reaction occurring within the last 10 years: No If all of the above answers are "NO", then may proceed with Cephalosporin use.   Lactase Diarrhea, Other (See Comments)   Chief Strategy Officer) IBS w/Diarrhea- upsets stomach Chief Strategy Officer) IBS w/Diarrhea- upsets stomach   Shellfish Allergy Itching   Shellfish-derived Products Hives   Itching   Latex Itching   Patient states that she does not need a test. Not really an allergy. Has had some itching with gloves but has had surgery before without issues Patient states that she does not need a test. Not really an allergy. Has had some itching with gloves but has had surgery before without issues      Medication List       Accurate as of June 27, 2019  6:21 PM. If you have any questions, ask your nurse or doctor.        STOP taking these medications   Belviq XR 20 MG Tb24 Generic drug: Lorcaserin HCl ER Stopped by: Edmonia Lynch, MD   gabapentin 300 MG capsule Commonly known as: NEURONTIN Stopped by: Edmonia Lynch, MD   oxybutynin 5 MG 24 hr tablet Commonly known as: DITROPAN-XL Stopped by: Edmonia Lynch, MD     TAKE these medications   albuterol 108 (90 Base) MCG/ACT inhaler Commonly known as: VENTOLIN HFA   Carbinoxamine Maleate 4 MG Tabs Take 1 tablet (4 mg total) by mouth every 6 (six) hours as needed.   DULoxetine 30 MG capsule Commonly known as: CYMBALTA   EPINEPHrine 0.3 mg/0.3 mL Soaj injection Commonly known as: Auvi-Q Inject 0.3 mLs (0.3 mg total) into the muscle as needed for anaphylaxis.   ergocalciferol 1.25 MG (50000 UT) capsule Commonly known as: VITAMIN D2 Take 1 capsule (50,000 Units total) by mouth 3 (three) times a week.   Flovent HFA 110 MCG/ACT inhaler Generic drug: fluticasone Inhale 2 puffs into the lungs 2 (two) times daily. Started by: Edmonia Lynch, MD   Fluocinolone Acetonide 0.01 %  Oil Place 5 drops in ear(s) 2 (two) times daily as needed (2 times per day as needed x 14 days).   Fluticasone Propionate 93 MCG/ACT Exhu Commonly known as: Xhance Place 2 sprays into the nose 2 (two) times daily.   folic acid 1 MG tablet Commonly known as: FOLVITE Take 1 tablet (1 mg total) by mouth daily.   hyoscyamine 0.375 MG 12 hr tablet Commonly known as: LEVBID Take 1 tablet (0.375 mg total) by mouth at bedtime.   hyoscyamine 0.125 MG Tbdp disintergrating tablet Commonly known as: ANASPAZ Place 1 tablet (0.125 mg total) under the tongue every 4 (four) hours as needed for bladder spasms.   ibuprofen 800 MG tablet Commonly known as: ADVIL Take 1 tablet (800 mg total) by mouth every 8 (eight) hours as needed (mild pain).   ipratropium 0.03 % nasal spray Commonly known as: ATROVENT   montelukast 10 MG tablet Commonly known as: SINGULAIR Take 1 tablet (10 mg total) by mouth at bedtime.   Olopatadine HCl 0.7 % Soln Commonly known as: Pazeo Place 1 drop into both eyes daily as needed.   topiramate 100 MG tablet Commonly known as: TOPAMAX Take by mouth.   valACYclovir 500  MG tablet Commonly known as: VALTREX       Allergies  Allergen Reactions  . Penicillins Hives, Rash and Other (See Comments)    Has patient had a PCN reaction causing immediate rash, facial/tongue/throat swelling, SOB or lightheadedness with hypotension: Yes Has patient had a PCN reaction causing severe rash involving mucus membranes or skin necrosis: No Has patient had a PCN reaction that required hospitalization No Has patient had a PCN reaction occurring within the last 10 years: No If all of the above answers are "NO", then may proceed with Cephalosporin use.   . Lactase Diarrhea and Other (See Comments)    (Dairy Products) IBS w/Diarrhea- upsets stomach Chief Strategy Officer) IBS w/Diarrhea- upsets stomach   . Shellfish Allergy Itching  . Shellfish-Derived Products Hives    Itching  . Latex  Itching    Patient states that she does not need a test. Not really an allergy. Has had some itching with gloves but has had surgery before without issues Patient states that she does not need a test. Not really an allergy. Has had some itching with gloves but has had surgery before without issues     Review of systems: Review of systems negative except as noted in HPI / PMHx or noted below: Constitutional: Negative.  HENT: Negative.   Eyes: Negative.  Respiratory: Negative.   Cardiovascular: Negative.  Gastrointestinal: Negative.  Genitourinary: Negative.  Musculoskeletal: Negative.  Neurological: Negative.  Endo/Heme/Allergies: Negative.  Cutaneous: Negative.   Past Medical History:  Diagnosis Date  . Anxiety   . Arthritis    hands and knees  . Bilateral knee pain   . Carpal tunnel syndrome   . Epigastric pain   . GERD (gastroesophageal reflux disease)   . Goiter   . H/O umbilical hernia repair   . Hyperplastic colon polyp   . IBS (irritable bowel syndrome)   . Intraocular pressure increase   . Iron deficiency anemia   . Iron malabsorption 05/30/2015  . Joint pain   . Labyrinthitis   . Lactose intolerance   . Livedo reticularis   . Menometrorrhagia 05/30/2015  . Migraine headache   . Obesity   . Pes planus   . PONV (postoperative nausea and vomiting)   . SOB (shortness of breath)   . Vitamin D deficiency     Family History  Problem Relation Age of Onset  . Fibromyalgia Mother   . Hypertension Father   . Diabetes Father   . Cancer Maternal Aunt   . Breast cancer Paternal Aunt     Social History   Socioeconomic History  . Marital status: Married    Spouse name: Not on file  . Number of children: Not on file  . Years of education: Not on file  . Highest education level: Not on file  Occupational History  . Not on file  Social Needs  . Financial resource strain: Not on file  . Food insecurity    Worry: Not on file    Inability: Not on file  .  Transportation needs    Medical: Not on file    Non-medical: Not on file  Tobacco Use  . Smoking status: Never Smoker  . Smokeless tobacco: Never Used  Substance and Sexual Activity  . Alcohol use: No    Alcohol/week: 0.0 standard drinks  . Drug use: No  . Sexual activity: Yes  Lifestyle  . Physical activity    Days per week: Not on file    Minutes per session:  Not on file  . Stress: Not on file  Relationships  . Social Herbalist on phone: Not on file    Gets together: Not on file    Attends religious service: Not on file    Active member of club or organization: Not on file    Attends meetings of clubs or organizations: Not on file    Relationship status: Not on file  . Intimate partner violence    Fear of current or ex partner: Not on file    Emotionally abused: Not on file    Physically abused: Not on file    Forced sexual activity: Not on file  Other Topics Concern  . Not on file  Social History Narrative  . Not on file    I appreciate the opportunity to take part in [place]'s care. Please do not hesitate to contact me with questions.  Sincerely,   R. Edgar Frisk, MD

## 2019-06-27 NOTE — Assessment & Plan Note (Signed)
   Continue appropriate allergen avoidance measures, carbinoxamine as needed, and Xhance nasal spray as needed.  Nasal saline lavage (NeilMed) has been recommended as needed and prior to medicated nasal sprays along with instructions for proper administration.  For thick post nasal drainage, add guaifenesin 1200 mg (Mucinex Maximum Strength)  twice daily as needed with adequate hydration as discussed.

## 2019-06-27 NOTE — Telephone Encounter (Signed)
Letter made and signed by Dr. Verlin Fester.

## 2019-06-27 NOTE — Patient Instructions (Addendum)
Phantosmia Unclear etiology.  Nasal saline lavage (NeilMed) has been recommended with monitoring for symptom improvement.  I have recommended further evaluation by otolaryngology, Dr. Benjamine Mola.  Mild persistent asthma  A prescription has been provided for Flovent (fluticasone) 110 g, 2 inhalations twice a day. To maximize pulmonary deposition, a spacer has been provided along with instructions for its proper administration with an HFA inhaler.  For now, continue montelukast 10 mg daily at bedtime and albuterol HFA, 1 to 2 inhalations every 4-6 hours if needed.  Subjective and objective measures of pulmonary function will be followed and the treatment plan will be adjusted accordingly.  Perennial and seasonal allergic rhinitis  Continue appropriate allergen avoidance measures, carbinoxamine as needed, and Xhance nasal spray as needed.  Nasal saline lavage (NeilMed) has been recommended as needed and prior to medicated nasal sprays along with instructions for proper administration.  For thick post nasal drainage, add guaifenesin 1200 mg (Mucinex Maximum Strength)  twice daily as needed with adequate hydration as discussed.   Return in about 4 months (around 10/28/2019), or if symptoms worsen or fail to improve.

## 2019-06-27 NOTE — Assessment & Plan Note (Signed)
Unclear etiology.  Nasal saline lavage (NeilMed) has been recommended with monitoring for symptom improvement.  I have recommended further evaluation by otolaryngology, Dr. Benjamine Mola.

## 2019-06-30 ENCOUNTER — Telehealth: Payer: Self-pay | Admitting: Allergy and Immunology

## 2019-06-30 NOTE — Telephone Encounter (Signed)
Patient called and stated that her job is still not satisfied with the letter that was sent on 06/27/2019. She stated that there are five questions that need to be addressed within the letter. I had her email me the questions which is shown below. I did inform patient that Dr. Verlin Fester will not be back into the office until Monday. Patient stated that was fine and she appreciates [place] doing what we can to get her the information her job is wanting.  As discussed, the following questions below must be addressed in full by Dr. Verlin Fester.  The [employer] Case Manager has contacted me again and explained to me that each numbered item below must be addressed.  For example (how it was explained to me):  1. Diagnosis: Asthma 2. Prognosis: Lifetime or Permanent 3. How does wearing a mask affect me (Breathing, talking, Concentration, etc.) 4. Describe how the symptoms of asthma and the medications can impact my performance on the job (side effects/impairments) 5. Dr. Starling Manns has already stated that I should telework from home - in the letter it needs to state 100% telework    *It appears there doesn't have to be any mention of a mask or COVID-19 from what the [employer] Case Manager explained to me.     Thank you,  Sherene Sires

## 2019-07-03 NOTE — Telephone Encounter (Signed)
Please edit the previous note with the following information:  1) asthma 2) persistent, no end date 3) the wearing of a mask restricts airflow and therefore increases work of breathing.  This is particularly problematic for patients with asthma. 4) wheezing, cough, shortness of breath, chest tightness.  the medications may cause jitteriness or increased heart rate. 5) it is my recommendation that the patient do 100% telework from home to avoid the risk of COVID-19 infection and to eliminate that need to wear a mask throughout the day.  Thanks.

## 2019-07-04 NOTE — Telephone Encounter (Signed)
Letter has been sent to patient via mychart. Called to inform patient but was unable to leave a message.

## 2019-07-05 ENCOUNTER — Other Ambulatory Visit: Payer: Self-pay | Admitting: *Deleted

## 2019-07-05 DIAGNOSIS — D500 Iron deficiency anemia secondary to blood loss (chronic): Secondary | ICD-10-CM

## 2019-07-05 NOTE — Telephone Encounter (Signed)
Patient sent me and email asking about an update. I informed patient that letter was sent via mychart.

## 2019-07-06 ENCOUNTER — Inpatient Hospital Stay: Payer: Federal, State, Local not specified - PPO | Attending: Hematology & Oncology | Admitting: Hematology & Oncology

## 2019-07-06 ENCOUNTER — Encounter: Payer: Self-pay | Admitting: Hematology & Oncology

## 2019-07-06 ENCOUNTER — Other Ambulatory Visit: Payer: Self-pay

## 2019-07-06 ENCOUNTER — Inpatient Hospital Stay: Payer: Federal, State, Local not specified - PPO

## 2019-07-06 ENCOUNTER — Telehealth: Payer: Self-pay | Admitting: Hematology & Oncology

## 2019-07-06 VITALS — BP 129/82 | HR 66 | Temp 98.6°F | Resp 16 | Wt 191.0 lb

## 2019-07-06 DIAGNOSIS — Z90710 Acquired absence of both cervix and uterus: Secondary | ICD-10-CM | POA: Diagnosis not present

## 2019-07-06 DIAGNOSIS — D560 Alpha thalassemia: Secondary | ICD-10-CM

## 2019-07-06 DIAGNOSIS — D500 Iron deficiency anemia secondary to blood loss (chronic): Secondary | ICD-10-CM

## 2019-07-06 DIAGNOSIS — D508 Other iron deficiency anemias: Secondary | ICD-10-CM | POA: Diagnosis present

## 2019-07-06 DIAGNOSIS — K909 Intestinal malabsorption, unspecified: Secondary | ICD-10-CM

## 2019-07-06 DIAGNOSIS — E559 Vitamin D deficiency, unspecified: Secondary | ICD-10-CM

## 2019-07-06 DIAGNOSIS — Z79899 Other long term (current) drug therapy: Secondary | ICD-10-CM | POA: Diagnosis not present

## 2019-07-06 LAB — CBC WITH DIFFERENTIAL (CANCER CENTER ONLY)
Abs Immature Granulocytes: 0.01 10*3/uL (ref 0.00–0.07)
Basophils Absolute: 0.1 10*3/uL (ref 0.0–0.1)
Basophils Relative: 1 %
Eosinophils Absolute: 0.2 10*3/uL (ref 0.0–0.5)
Eosinophils Relative: 4 %
HCT: 44.4 % (ref 36.0–46.0)
Hemoglobin: 14.6 g/dL (ref 12.0–15.0)
Immature Granulocytes: 0 %
Lymphocytes Relative: 31 %
Lymphs Abs: 1.2 10*3/uL (ref 0.7–4.0)
MCH: 29.7 pg (ref 26.0–34.0)
MCHC: 32.9 g/dL (ref 30.0–36.0)
MCV: 90.2 fL (ref 80.0–100.0)
Monocytes Absolute: 0.3 10*3/uL (ref 0.1–1.0)
Monocytes Relative: 6 %
Neutro Abs: 2.3 10*3/uL (ref 1.7–7.7)
Neutrophils Relative %: 58 %
Platelet Count: 278 10*3/uL (ref 150–400)
RBC: 4.92 MIL/uL (ref 3.87–5.11)
RDW: 12.6 % (ref 11.5–15.5)
WBC Count: 4 10*3/uL (ref 4.0–10.5)
nRBC: 0 % (ref 0.0–0.2)

## 2019-07-06 LAB — COMPREHENSIVE METABOLIC PANEL
ALT: 4 U/L (ref 0–44)
AST: 11 U/L — ABNORMAL LOW (ref 15–41)
Albumin: 4.1 g/dL (ref 3.5–5.0)
Alkaline Phosphatase: 58 U/L (ref 38–126)
Anion gap: 8 (ref 5–15)
BUN: 10 mg/dL (ref 6–20)
CO2: 23 mmol/L (ref 22–32)
Calcium: 8.6 mg/dL — ABNORMAL LOW (ref 8.9–10.3)
Chloride: 108 mmol/L (ref 98–111)
Creatinine, Ser: 0.92 mg/dL (ref 0.44–1.00)
GFR calc Af Amer: >60 mL/min (ref >60)
GFR calc non Af Amer: >60 mL/min (ref >60)
Glucose, Bld: 97 mg/dL (ref 70–99)
Potassium: 3.7 mmol/L (ref 3.5–5.1)
Sodium: 139 mmol/L (ref 135–145)
Total Bilirubin: 0.5 mg/dL (ref 0.3–1.2)
Total Protein: 6.9 g/dL (ref 6.5–8.1)

## 2019-07-06 LAB — IRON AND TIBC
Iron: 78 ug/dL (ref 41–142)
Saturation Ratios: 27 % (ref 21–57)
TIBC: 293 ug/dL (ref 236–444)
UIBC: 214 ug/dL (ref 120–384)

## 2019-07-06 LAB — FERRITIN: Ferritin: 160 ng/mL (ref 11–307)

## 2019-07-06 MED ORDER — FOLIC ACID 1 MG PO TABS: 1.0000 mg | ORAL_TABLET | Freq: Every day | ORAL | 9 refills | Status: AC

## 2019-07-06 NOTE — Telephone Encounter (Signed)
Called and lmvm for patient regaridng appointments added per 7/30 los

## 2019-07-06 NOTE — Progress Notes (Signed)
Hematology and Oncology Follow Up Visit  Nicole Booth [id number] 1976-03-10 43 y.o. 01/07/2018   Principle Diagnosis:   Iron deficiency anemia secondary to menometrorrhagia  Current Therapy:    Status post hysterectomy        IV iron as indicated-last dose given on 00/34/9179        Folic acid 1 mg po q day     Interim History:  Nicole Booth is back for follow-up.  So far, she is managing the coronavirus.  She is under a lot of stress.  Her 2 sons are at home.  They are doing okay at home.  She has 1 I think will graduate high school this year.  He really does not have much of a care to go back to school.  It is her #1 he was in fourth grade that she worries about.  She apparently has been diagnosed with fibromyalgia.  She is on Neurontin for this.  She is not taking her vitamin D as she should.  Is not taking her folic acid.  She has not had her monthly cycles because of a hysterectomy.  Her iron studies that we did last year showed a ferritin of 170 with an iron saturation of 47%.  She did have a very low vitamin D level when we saw her a year ago.  Again, I told her that she has to take vitamin D weekly.  She is having no problems with bowels or bladder.  Overall, her performance status is ECOG 0.  Medications:  Current Outpatient Medications:    ergocalciferol (VITAMIN D2) 50000 units capsule, Take 1 capsule (50,000 Units total) once a week by mouth., Disp: 8 capsule, Rfl: 5   folic acid (FOLVITE) 1 MG tablet, Take 1 tablet (1 mg total) by mouth daily., Disp: 90 tablet, Rfl: 9   gabapentin (NEURONTIN) 300 MG capsule, Take 300 mg by mouth 2 (two) times daily., Disp: , Rfl:    hyoscyamine (ANASPAZ) 0.125 MG TBDP disintergrating tablet, Place 0.125 mg under the tongue every 4 (four) hours as needed for bladder spasms. , Disp: , Rfl: 6   hyoscyamine (LEVBID) 0.375 MG 12 hr tablet, Take 1 tablet (0.375 mg total) by mouth at bedtime., Disp: 60 tablet, Rfl: 6    ibuprofen (ADVIL,MOTRIN) 800 MG tablet, Take 1 tablet (800 mg total) by mouth every 8 (eight) hours as needed (mild pain)., Disp: 30 tablet, Rfl: 1   solifenacin (VESICARE) 10 MG tablet, Take 10 mg by mouth., Disp: , Rfl:   Allergies:  Allergies  Allergen Reactions   Penicillins Hives and Rash    Has patient had a PCN reaction causing immediate rash, facial/tongue/throat swelling, SOB or lightheadedness with hypotension: Yes Has patient had a PCN reaction causing severe rash involving mucus membranes or skin necrosis: No Has patient had a PCN reaction that required hospitalization No Has patient had a PCN reaction occurring within the last 10 years: No If all of the above answers are "NO", then may proceed with Cephalosporin use.    Dairy Aid [Lactase] Diarrhea   Shellfish-Derived Products Hives    Itching    Past Medical History, Surgical history, Social history, and Family Hx: reviewed the past history, etc. Everything is as listed above in the interim history.  Review of Systems: Review of Systems  Constitutional: Negative.   HENT: Negative.   Eyes: Negative.   Respiratory: Negative.   Cardiovascular: Negative.   Gastrointestinal: Negative.   Genitourinary: Negative.   Musculoskeletal: Negative.  Skin: Negative.   Neurological: Negative.   Endo/Heme/Allergies: Negative.   Psychiatric/Behavioral: Negative.     Physical Exam:  weight is 235 lb 6.4 oz (106.8 kg). Her oral temperature is 98.5 F (36.9 C). Her blood pressure is 100/54 (abnormal) and her pulse is 92. Her oxygen saturation is 100%.   Wt Readings from Last 3 Encounters:  01/07/18 235 lb 6.4 oz (106.8 kg)  07/28/17 217 lb (98.4 kg)  04/30/17 204 lb 1.9 oz (92.6 kg)      Physical Exam Vitals signs reviewed.  HENT:     Head: Normocephalic and atraumatic.  Eyes:     Pupils: Pupils are equal, round, and reactive to light.  Neck:     Musculoskeletal: Normal range of motion.  Cardiovascular:     Rate  and Rhythm: Normal rate and regular rhythm.     Heart sounds: Normal heart sounds.  Pulmonary:     Effort: Pulmonary effort is normal.     Breath sounds: Normal breath sounds.  Abdominal:     General: Bowel sounds are normal.     Palpations: Abdomen is soft.  Musculoskeletal: Normal range of motion.        General: No tenderness or deformity.  Lymphadenopathy:     Cervical: No cervical adenopathy.  Skin:    General: Skin is warm and dry.     Findings: No erythema or rash.  Neurological:     Mental Status: She is alert and oriented to person, place, and time.  Psychiatric:        Behavior: Behavior normal.        Thought Content: Thought content normal.        Judgment: Judgment normal.     Lab Results  Component Value Date   WBC 7.2 01/07/2018   HGB 14.8 10/07/2017   HCT 43.3 01/07/2018   MCV 88.9 01/07/2018   PLT 297 01/07/2018     Chemistry      Component Value Date/Time   NA 134 01/22/2017 1341   NA 137 12/15/2016 1050   K 3.6 01/22/2017 1341   K 4.2 12/15/2016 1050   CL 104 01/22/2017 1341   CO2 23 01/22/2017 1341   CO2 26 12/15/2016 1050   BUN 13 01/22/2017 1341   BUN 11.1 12/15/2016 1050   CREATININE 1.07 (H) 01/22/2017 1341   CREATININE 0.9 12/15/2016 1050      Component Value Date/Time   CALCIUM 9.9 01/22/2017 1341   CALCIUM 9.1 12/15/2016 1050   ALKPHOS 60 01/22/2017 1341   ALKPHOS 66 12/15/2016 1050   AST 11 01/22/2017 1341   AST 15 12/15/2016 1050   ALT 5 01/22/2017 1341   ALT <6 12/15/2016 1050   BILITOT 0.4 01/22/2017 1341   BILITOT 0.45 12/15/2016 1050         Impression and Plan: Nicole Booth is a 43 year old African-American female.  Everything looks pretty good right now.  Her weight is coming down a little bit.  I would not think that her iron level would be low given the MCV.  Since is been a year since we saw her and she is still doing okay, we will have her come back in another year.  [name], MD 2/1/20193:51 PM

## 2019-07-06 NOTE — Telephone Encounter (Signed)
Spoke with patient and she stated that the reasonable Charity fundraiser said he would take the last letter and process it, but it would be up to the approving officials to approve or deny it. He said he was trying to keep from getting it denied. She stated that she believes the mask statement was HIS issue. She stated "Tell Dr. Starling Manns I said THANK YOU and [place] for all your hard work on this letter.  I really appreciate it!!"

## 2019-07-06 NOTE — Telephone Encounter (Signed)
Received an email from the patient stating:  I will need to make an appointment to discuss this with him. He addressed the mask and COVID. The Reasonable Accommodation  manager asked that he remove that from the last letter so he can move it forward.    Nicole Booth   I offered to schedule her an in person visit, mychart web visit or a televisit for you to discuss this with Dr. Verlin Fester. Patient would like a televisit. Dr. Verlin Fester is out of the office next week and she is needing to speak with him about this matter so her place of employment can move forward.

## 2019-07-06 NOTE — Telephone Encounter (Signed)
Noted. Thanks.

## 2019-07-06 NOTE — Telephone Encounter (Signed)
If all she needs is the line about the mask removed and everything else can stay the same, then go ahead and remove that line. Hopefully she will be able to work from home and that will put an end to it.

## 2019-07-07 ENCOUNTER — Telehealth: Payer: Self-pay | Admitting: *Deleted

## 2019-07-07 NOTE — Telephone Encounter (Signed)
As noted below by Dr. Marin Olp, I informed patient of the iron level. She verbalized understanding.

## 2019-07-07 NOTE — Telephone Encounter (Signed)
-----   Message from [name], MD sent at 07/06/2019  2:33 PM EDT ----- Call - the iron level is ok!! Nicole Booth

## 2019-07-25 ENCOUNTER — Ambulatory Visit: Payer: Federal, State, Local not specified - PPO | Admitting: Allergy and Immunology

## 2019-08-22 ENCOUNTER — Telehealth: Payer: Self-pay

## 2019-08-22 NOTE — Telephone Encounter (Signed)
Patient called and would like a copy of her medical records. I told her I would send a message to [place], and nicole would be in touch if she needed more information.

## 2019-09-05 ENCOUNTER — Ambulatory Visit: Payer: Federal, State, Local not specified - PPO | Admitting: Allergy and Immunology

## 2019-09-08 ENCOUNTER — Other Ambulatory Visit: Payer: Self-pay | Admitting: Physician Assistant

## 2019-09-08 DIAGNOSIS — Z1231 Encounter for screening mammogram for malignant neoplasm of breast: Secondary | ICD-10-CM

## 2019-11-07 ENCOUNTER — Ambulatory Visit: Payer: Federal, State, Local not specified - PPO | Admitting: Allergy and Immunology

## 2019-11-07 ENCOUNTER — Other Ambulatory Visit: Payer: Self-pay

## 2019-11-07 ENCOUNTER — Encounter: Payer: Self-pay | Admitting: Allergy and Immunology

## 2019-11-07 DIAGNOSIS — J45901 Unspecified asthma with (acute) exacerbation: Secondary | ICD-10-CM | POA: Diagnosis not present

## 2019-11-07 DIAGNOSIS — R442 Other hallucinations: Secondary | ICD-10-CM

## 2019-11-07 DIAGNOSIS — J3089 Other allergic rhinitis: Secondary | ICD-10-CM | POA: Diagnosis not present

## 2019-11-07 MED ORDER — BUDESONIDE-FORMOTEROL FUMARATE 160-4.5 MCG/ACT IN AERO: 2.0000 | INHALATION_SPRAY | Freq: Two times a day (BID) | RESPIRATORY_TRACT | 5 refills | Status: AC

## 2019-11-07 NOTE — Patient Instructions (Addendum)
Asthma with acute exacerbation  Prednisone has been provided, 40 mg x3 days, 20 mg x1 day, 10 mg x1 day, then stop. A prescription has been provided for Symbicort (budesonide/formoterol) 160/4.5 g, 2 inhalations via spacer device twice a day.  Per the patient's request, a prescription has been provided for albuterol 0.083% solution via nebulizer every 4-6 hours if needed.  The patient has been asked to contact me if these symptoms persist or progress spite the treatment plan as outlined above, otherwise she may return for follow-up in 4 months or sooner if needed.  Perennial and seasonal allergic rhinitis  Continue appropriate allergen avoidance measures, carbinoxamine as needed, and Xhance nasal spray as needed.  Nasal saline lavage (NeilMed) has been recommended as needed and prior to medicated nasal sprays along with instructions for proper administration.  For thick post nasal drainage, add guaifenesin 1200 mg (Mucinex Maximum Strength)  twice daily as needed with adequate hydration as discussed.  Phantosmia Resolved.   Return in about 4 months (around 03/07/2020), or if symptoms worsen or fail to improve.

## 2019-11-07 NOTE — Assessment & Plan Note (Signed)
Resolved

## 2019-11-07 NOTE — Progress Notes (Signed)
Follow-up Note  RE: Nicole Booth MRN: [id number] DOB: Nov 06, 1976 Date of Office Visit: 11/07/2019  Primary care provider: Jonathon Bellows, PA-C Referring provider: Keane Scrape*  History of present illness: Nicole Booth is a 43 y.o. female with persistent asthma, allergic rhinoconjunctivitis, and history of urticaria/food allergy presenting today for follow-up.  She was last seen in this clinic on June 27, 2019.  She complains of shortness of breath with mild exertion, such as walking up stairs or walking from one room to the next.  This dyspnea has been a problem over the past month, prior to that her asthma had been well controlled with Flovent 110 g, 2 inhalations via spacer device twice daily.  She reports that her nasal allergy symptoms have been well controlled and that the phantom smells have resolved.  Assessment and plan: Asthma with acute exacerbation  Prednisone has been provided, 40 mg x3 days, 20 mg x1 day, 10 mg x1 day, then stop. A prescription has been provided for Symbicort (budesonide/formoterol) 160/4.5 g, 2 inhalations via spacer device twice a day.  Per the patient's request, a prescription has been provided for albuterol 0.083% solution via nebulizer every 4-6 hours if needed.  The patient has been asked to contact me if these symptoms persist or progress spite the treatment plan as outlined above, otherwise she may return for follow-up in 4 months or sooner if needed.  Perennial and seasonal allergic rhinitis  Continue appropriate allergen avoidance measures, carbinoxamine as needed, and Xhance nasal spray as needed.  Nasal saline lavage (NeilMed) has been recommended as needed and prior to medicated nasal sprays along with instructions for proper administration.  For thick post nasal drainage, add guaifenesin 1200 mg (Mucinex Maximum Strength)  twice daily as needed with adequate hydration as discussed.  Phantosmia  Resolved.   Meds ordered this encounter  Medications  . budesonide-formoterol (SYMBICORT) 160-4.5 MCG/ACT inhaler    Sig: Inhale 2 puffs into the lungs 2 (two) times daily.    Dispense:  1 Inhaler    Refill:  5    Diagnostics: Spirometry:  Normal with an FEV1 of 129% predicted with an FEV1 ratio of 100%. This study was performed while the patient was asymptomatic.  Please see scanned spirometry results for details.    Physical examination: Blood pressure 112/72, pulse 73, resp. rate 17, last menstrual period 07/10/2016, SpO2 96 %.  General: Alert, interactive, in no acute distress. HEENT: TMs pearly gray, turbinates mildly edematous without discharge, post-pharynx mildly erythematous. Neck: Supple without lymphadenopathy. Lungs: Clear to auscultation without wheezing, rhonchi or rales. CV: Normal S1, S2 without murmurs. Skin: Warm and dry, without lesions or rashes.  The following portions of the patient's history were reviewed and updated as appropriate: allergies, current medications, past family history, past medical history, past social history, past surgical history and problem list.   Current Outpatient Medications  Medication Sig Dispense Refill  . acetaminophen (TYLENOL) 500 MG tablet Take by mouth 3 (three) times daily as needed.    Marland Kitchen albuterol (PROVENTIL HFA;VENTOLIN HFA) 108 (90 Base) MCG/ACT inhaler     . Carbinoxamine Maleate 4 MG TABS Take 1 tablet (4 mg total) by mouth every 6 (six) hours as needed. 28 each 5  . DULoxetine (CYMBALTA) 30 MG capsule     . EPINEPHrine (AUVI-Q) 0.3 mg/0.3 mL IJ SOAJ injection Inject 0.3 mLs (0.3 mg total) into the muscle as needed for anaphylaxis. 2 Device 1  . ergocalciferol (VITAMIN D2) 50000 units  capsule Take 1 capsule (50,000 Units total) by mouth 3 (three) times a week. 36 capsule 3  . Fluocinolone Acetonide 0.01 % OIL Place 5 drops in ear(s) 2 (two) times daily as needed (2 times per day as needed x 14 days). 1 Bottle 0  .  fluticasone (FLOVENT HFA) 110 MCG/ACT inhaler Inhale 2 puffs into the lungs 2 (two) times daily. 1 Inhaler 5  . folic acid (FOLVITE) 1 MG tablet Take 1 tablet (1 mg total) by mouth daily. 90 tablet 9  . hyoscyamine (ANASPAZ) 0.125 MG TBDP disintergrating tablet Place 1 tablet (0.125 mg total) under the tongue every 4 (four) hours as needed for bladder spasms. 60 tablet 6  . hyoscyamine (LEVBID) 0.375 MG 12 hr tablet Take 1 tablet (0.375 mg total) by mouth at bedtime. 60 tablet 6  . ibuprofen (ADVIL,MOTRIN) 800 MG tablet Take 1 tablet (800 mg total) by mouth every 8 (eight) hours as needed (mild pain). 30 tablet 1  . ipratropium (ATROVENT) 0.03 % nasal spray     . montelukast (SINGULAIR) 10 MG tablet Take 1 tablet (10 mg total) by mouth at bedtime. 30 tablet 5  . Olopatadine HCl (PAZEO) 0.7 % SOLN Place 1 drop into both eyes daily as needed. 1 Bottle 5  . phentermine 15 MG capsule Take 15 mg by mouth daily.    Marland Kitchen topiramate (TOPAMAX) 100 MG tablet Take by mouth.    . valACYclovir (VALTREX) 500 MG tablet     . budesonide-formoterol (SYMBICORT) 160-4.5 MCG/ACT inhaler Inhale 2 puffs into the lungs 2 (two) times daily. 1 Inhaler 5  . Fluticasone Propionate (XHANCE) 93 MCG/ACT EXHU Place 2 sprays into the nose 2 (two) times daily. (Patient not taking: Reported on 11/07/2019) 32 mL 5   No current facility-administered medications for this visit.     Allergies  Allergen Reactions  . Penicillins Hives, Rash and Other (See Comments)    Has patient had a PCN reaction causing immediate rash, facial/tongue/throat swelling, SOB or lightheadedness with hypotension: Yes Has patient had a PCN reaction causing severe rash involving mucus membranes or skin necrosis: No Has patient had a PCN reaction that required hospitalization No Has patient had a PCN reaction occurring within the last 10 years: No If all of the above answers are "NO", then may proceed with Cephalosporin use.   . Lactase Diarrhea and Other  (See Comments)    (Dairy Products) IBS w/Diarrhea- upsets stomach Chief Strategy Officer) IBS w/Diarrhea- upsets stomach   . Shellfish Allergy Itching  . Shellfish-Derived Products Hives    Itching  . Latex Itching    Patient states that she does not need a test. Not really an allergy. Has had some itching with gloves but has had surgery before without issues Patient states that she does not need a test. Not really an allergy. Has had some itching with gloves but has had surgery before without issues    Review of systems: Review of systems negative except as noted in HPI / PMHx or noted below: Constitutional: Negative.  HENT: Negative.   Eyes: Negative.  Respiratory: Negative.   Cardiovascular: Negative.  Gastrointestinal: Negative.  Genitourinary: Negative.  Musculoskeletal: Negative.  Neurological: Negative.  Endo/Heme/Allergies: Negative.  Cutaneous: Negative.   Past Medical History:  Diagnosis Date  . Anxiety   . Arthritis    hands and knees  . Bilateral knee pain   . Carpal tunnel syndrome   . Epigastric pain   . GERD (gastroesophageal reflux disease)   .  Goiter   . H/O umbilical hernia repair   . Hyperplastic colon polyp   . IBS (irritable bowel syndrome)   . Intraocular pressure increase   . Iron deficiency anemia   . Iron malabsorption 05/30/2015  . Joint pain   . Labyrinthitis   . Lactose intolerance   . Livedo reticularis   . Menometrorrhagia 05/30/2015  . Migraine headache   . Obesity   . Pes planus   . PONV (postoperative nausea and vomiting)   . SOB (shortness of breath)   . Vitamin D deficiency     Family History  Problem Relation Age of Onset  . Fibromyalgia Mother   . Hypertension Father   . Diabetes Father   . Cancer Maternal Aunt   . Breast cancer Paternal Aunt     Social History   Socioeconomic History  . Marital status: Married    Spouse name: Not on file  . Number of children: Not on file  . Years of education: Not on file  . Highest  education level: Not on file  Occupational History  . Not on file  Social Needs  . Financial resource strain: Not on file  . Food insecurity    Worry: Not on file    Inability: Not on file  . Transportation needs    Medical: Not on file    Non-medical: Not on file  Tobacco Use  . Smoking status: Never Smoker  . Smokeless tobacco: Never Used  Substance and Sexual Activity  . Alcohol use: No    Alcohol/week: 0.0 standard drinks  . Drug use: No  . Sexual activity: Yes  Lifestyle  . Physical activity    Days per week: Not on file    Minutes per session: Not on file  . Stress: Not on file  Relationships  . Social Herbalist on phone: Not on file    Gets together: Not on file    Attends religious service: Not on file    Active member of club or organization: Not on file    Attends meetings of clubs or organizations: Not on file    Relationship status: Not on file  . Intimate partner violence    Fear of current or ex partner: Not on file    Emotionally abused: Not on file    Physically abused: Not on file    Forced sexual activity: Not on file  Other Topics Concern  . Not on file  Social History Narrative  . Not on file    I appreciate the opportunity to take part in Jannat's care. Please do not hesitate to contact me with questions.  Sincerely,   R. Edgar Frisk, MD

## 2019-11-07 NOTE — Assessment & Plan Note (Addendum)
   Prednisone has been provided, 40 mg x3 days, 20 mg x1 day, 10 mg x1 day, then stop. A prescription has been provided for Symbicort (budesonide/formoterol) 160/4.5 g, 2 inhalations via spacer device twice a day.  Per the patient's request, a prescription has been provided for albuterol 0.083% solution via nebulizer every 4-6 hours if needed.  The patient has been asked to contact me if these symptoms persist or progress spite the treatment plan as outlined above, otherwise she may return for follow-up in 4 months or sooner if needed.

## 2019-11-07 NOTE — Assessment & Plan Note (Signed)
   Continue appropriate allergen avoidance measures, carbinoxamine as needed, and Xhance nasal spray as needed.  Nasal saline lavage (NeilMed) has been recommended as needed and prior to medicated nasal sprays along with instructions for proper administration.  For thick post nasal drainage, add guaifenesin 1200 mg (Mucinex Maximum Strength)  twice daily as needed with adequate hydration as discussed.

## 2019-11-22 ENCOUNTER — Telehealth: Payer: Self-pay | Admitting: *Deleted

## 2019-11-22 NOTE — Telephone Encounter (Signed)
Call received from patient stating that she is "freezing" and would like to come in to see Dr. Marin Olp after Christmas.  Dr. Marin Olp notified and message sent to scheduling.

## 2019-11-24 ENCOUNTER — Other Ambulatory Visit: Payer: Self-pay

## 2019-11-24 ENCOUNTER — Ambulatory Visit
Admission: RE | Admit: 2019-11-24 | Discharge: 2019-11-24 | Disposition: A | Discharge status: Home / Self Care | Payer: Federal, State, Local not specified - PPO | Source: Ambulatory Visit | Attending: Physician Assistant | Admitting: Physician Assistant

## 2019-11-24 DIAGNOSIS — Z1231 Encounter for screening mammogram for malignant neoplasm of breast: Secondary | ICD-10-CM

## 2019-12-07 ENCOUNTER — Inpatient Hospital Stay: Payer: Federal, State, Local not specified - PPO | Attending: Hematology & Oncology

## 2019-12-07 ENCOUNTER — Other Ambulatory Visit: Payer: Self-pay

## 2019-12-07 ENCOUNTER — Inpatient Hospital Stay (HOSPITAL_BASED_OUTPATIENT_CLINIC_OR_DEPARTMENT_OTHER): Payer: Federal, State, Local not specified - PPO | Admitting: Hematology & Oncology

## 2019-12-07 VITALS — BP 107/75 | HR 60 | Temp 97.3°F | Resp 18 | Wt 198.0 lb

## 2019-12-07 DIAGNOSIS — K909 Intestinal malabsorption, unspecified: Secondary | ICD-10-CM

## 2019-12-07 DIAGNOSIS — Z90710 Acquired absence of both cervix and uterus: Secondary | ICD-10-CM | POA: Diagnosis not present

## 2019-12-07 DIAGNOSIS — E559 Vitamin D deficiency, unspecified: Secondary | ICD-10-CM

## 2019-12-07 DIAGNOSIS — Z791 Long term (current) use of non-steroidal anti-inflammatories (NSAID): Secondary | ICD-10-CM | POA: Insufficient documentation

## 2019-12-07 DIAGNOSIS — N921 Excessive and frequent menstruation with irregular cycle: Secondary | ICD-10-CM | POA: Insufficient documentation

## 2019-12-07 DIAGNOSIS — D500 Iron deficiency anemia secondary to blood loss (chronic): Secondary | ICD-10-CM | POA: Diagnosis present

## 2019-12-07 DIAGNOSIS — Z79899 Other long term (current) drug therapy: Secondary | ICD-10-CM | POA: Insufficient documentation

## 2019-12-07 LAB — CBC WITH DIFFERENTIAL (CANCER CENTER ONLY)
Abs Immature Granulocytes: 0.01 10*3/uL (ref 0.00–0.07)
Basophils Absolute: 0 10*3/uL (ref 0.0–0.1)
Basophils Relative: 1 %
Eosinophils Absolute: 0.2 10*3/uL (ref 0.0–0.5)
Eosinophils Relative: 4 %
HCT: 44.3 % (ref 36.0–46.0)
Hemoglobin: 14.8 g/dL (ref 12.0–15.0)
Immature Granulocytes: 0 %
Lymphocytes Relative: 29 %
Lymphs Abs: 1.4 10*3/uL (ref 0.7–4.0)
MCH: 29.8 pg (ref 26.0–34.0)
MCHC: 33.4 g/dL (ref 30.0–36.0)
MCV: 89.3 fL (ref 80.0–100.0)
Monocytes Absolute: 0.3 10*3/uL (ref 0.1–1.0)
Monocytes Relative: 6 %
Neutro Abs: 2.8 10*3/uL (ref 1.7–7.7)
Neutrophils Relative %: 60 %
Platelet Count: 252 10*3/uL (ref 150–400)
RBC: 4.96 MIL/uL (ref 3.87–5.11)
RDW: 12.2 % (ref 11.5–15.5)
WBC Count: 4.7 10*3/uL (ref 4.0–10.5)
nRBC: 0 % (ref 0.0–0.2)

## 2019-12-07 LAB — CMP (CANCER CENTER ONLY)
ALT: 6 U/L (ref 0–44)
AST: 12 U/L — ABNORMAL LOW (ref 15–41)
Albumin: 4.3 g/dL (ref 3.5–5.0)
Alkaline Phosphatase: 55 U/L (ref 38–126)
Anion gap: 6 (ref 5–15)
BUN: 13 mg/dL (ref 6–20)
CO2: 26 mmol/L (ref 22–32)
Calcium: 9.2 mg/dL (ref 8.9–10.3)
Chloride: 108 mmol/L (ref 98–111)
Creatinine: 1.08 mg/dL — ABNORMAL HIGH (ref 0.44–1.00)
GFR, Est AFR Am: >60 mL/min (ref >60)
GFR, Estimated: >60 mL/min (ref >60)
Glucose, Bld: 98 mg/dL (ref 70–99)
Potassium: 3.6 mmol/L (ref 3.5–5.1)
Sodium: 140 mmol/L (ref 135–145)
Total Bilirubin: 0.5 mg/dL (ref 0.3–1.2)
Total Protein: 7 g/dL (ref 6.5–8.1)

## 2019-12-07 LAB — VITAMIN D 25 HYDROXY (VIT D DEFICIENCY, FRACTURES): Vit D, 25-Hydroxy: 57.95 ng/mL (ref 30–100)

## 2019-12-07 LAB — IRON AND TIBC
Iron: 62 ug/dL (ref 41–142)
Saturation Ratios: 21 % (ref 21–57)
TIBC: 291 ug/dL (ref 236–444)
UIBC: 229 ug/dL (ref 120–384)

## 2019-12-07 LAB — FERRITIN: Ferritin: 127 ng/mL (ref 11–307)

## 2019-12-07 MED ORDER — ERGOCALCIFEROL 1.25 MG (50000 UT) PO CAPS: 50000.0000 [IU] | ORAL_CAPSULE | ORAL | 3 refills | Status: AC

## 2019-12-07 NOTE — Progress Notes (Signed)
Hematology and Oncology Follow Up Visit  Nicole Booth [id number] July 31, 1976 43 y.o. 01/07/2018   Principle Diagnosis:   Iron deficiency anemia secondary to menometrorrhagia  Current Therapy:    Status post hysterectomy        IV iron as indicated-last dose given on 99991111        Folic acid 1 mg po q day     Interim History:  [name] is back for follow-up.  She is doing okay.  She really has no specific complaints although she does feel tired.  She saw an endocrinologist yesterday.  She had some lab work done.  I am sure that the endocrinologist will check in for her thyroid.  Her iron levels have been okay.  Back in July we last saw her, her ferritin was 160 with iron saturation of 27%.  She has had no obvious issues with cough or shortness of breath.  She has had no fever.  She is being very cautious with the coronavirus.  There is been no change in bowel or bladder habits.  She has had no bleeding.  She does have any monthly cycles because of a hysterectomy.  I am sure the hysterectomy has really solved her issues with iron deficiency.  Overall, her performance status is ECOG 1.    Medications:  Current Outpatient Medications:  .  ergocalciferol (VITAMIN D2) 50000 units capsule, Take 1 capsule (50,000 Units total) once a week by mouth., Disp: 8 capsule, Rfl: 5 .  folic acid (FOLVITE) 1 MG tablet, Take 1 tablet (1 mg total) by mouth daily., Disp: 90 tablet, Rfl: 9 .  gabapentin (NEURONTIN) 300 MG capsule, Take 300 mg by mouth 2 (two) times daily., Disp: , Rfl:  .  hyoscyamine (ANASPAZ) 0.125 MG TBDP disintergrating tablet, Place 0.125 mg under the tongue every 4 (four) hours as needed for bladder spasms. , Disp: , Rfl: 6 .  hyoscyamine (LEVBID) 0.375 MG 12 hr tablet, Take 1 tablet (0.375 mg total) by mouth at bedtime., Disp: 60 tablet, Rfl: 6 .  ibuprofen (ADVIL,MOTRIN) 800 MG tablet, Take 1 tablet (800 mg total) by mouth every 8 (eight) hours as needed (mild  pain)., Disp: 30 tablet, Rfl: 1 .  solifenacin (VESICARE) 10 MG tablet, Take 10 mg by mouth., Disp: , Rfl:   Allergies:  Allergies  Allergen Reactions  . Penicillins Hives and Rash    Has patient had a PCN reaction causing immediate rash, facial/tongue/throat swelling, SOB or lightheadedness with hypotension: Yes Has patient had a PCN reaction causing severe rash involving mucus membranes or skin necrosis: No Has patient had a PCN reaction that required hospitalization No Has patient had a PCN reaction occurring within the last 10 years: No If all of the above answers are "NO", then may proceed with Cephalosporin use.   . Dairy Aid [Lactase] Diarrhea  . Shellfish-Derived Products Hives    Itching    Past Medical History, Surgical history, Social history, and Family Hx: reviewed the past history, etc. Everything is as listed above in the interim history.  Review of Systems: Review of Systems  Constitutional: Negative.   HENT: Negative.   Eyes: Negative.   Respiratory: Negative.   Cardiovascular: Negative.   Gastrointestinal: Negative.   Genitourinary: Negative.   Musculoskeletal: Negative.   Skin: Negative.   Neurological: Negative.   Endo/Heme/Allergies: Negative.   Psychiatric/Behavioral: Negative.     Physical Exam:  weight is 235 lb 6.4 oz (106.8 kg). Her oral temperature is 98.5 F (  36.9 C). Her blood pressure is 100/54 (abnormal) and her pulse is 92. Her oxygen saturation is 100%.   Wt Readings from Last 3 Encounters:  01/07/18 235 lb 6.4 oz (106.8 kg)  07/28/17 217 lb (98.4 kg)  04/30/17 204 lb 1.9 oz (92.6 kg)      Physical Exam Vitals reviewed.  HENT:     Head: Normocephalic and atraumatic.  Eyes:     Pupils: Pupils are equal, round, and reactive to light.  Cardiovascular:     Rate and Rhythm: Normal rate and regular rhythm.     Heart sounds: Normal heart sounds.  Pulmonary:     Effort: Pulmonary effort is normal.     Breath sounds: Normal breath  sounds.  Abdominal:     General: Bowel sounds are normal.     Palpations: Abdomen is soft.  Musculoskeletal:        General: No tenderness or deformity. Normal range of motion.     Cervical back: Normal range of motion.  Lymphadenopathy:     Cervical: No cervical adenopathy.  Skin:    General: Skin is warm and dry.     Findings: No erythema or rash.  Neurological:     Mental Status: She is alert and oriented to person, place, and time.  Psychiatric:        Behavior: Behavior normal.        Thought Content: Thought content normal.        Judgment: Judgment normal.     Lab Results  Component Value Date   WBC 7.2 01/07/2018   HGB 14.8 10/07/2017   HCT 43.3 01/07/2018   MCV 88.9 01/07/2018   PLT 297 01/07/2018     Chemistry      Component Value Date/Time   NA 134 01/22/2017 1341   NA 137 12/15/2016 1050   K 3.6 01/22/2017 1341   K 4.2 12/15/2016 1050   CL 104 01/22/2017 1341   CO2 23 01/22/2017 1341   CO2 26 12/15/2016 1050   BUN 13 01/22/2017 1341   BUN 11.1 12/15/2016 1050   CREATININE 1.07 (H) 01/22/2017 1341   CREATININE 0.9 12/15/2016 1050      Component Value Date/Time   CALCIUM 9.9 01/22/2017 1341   CALCIUM 9.1 12/15/2016 1050   ALKPHOS 60 01/22/2017 1341   ALKPHOS 66 12/15/2016 1050   AST 11 01/22/2017 1341   AST 15 12/15/2016 1050   ALT 5 01/22/2017 1341   ALT <6 12/15/2016 1050   BILITOT 0.4 01/22/2017 1341   BILITOT 0.45 12/15/2016 1050         Impression and Plan: [name] is a 43 year old African-American female.  Everything looks pretty good right now.  Her weight is coming down a little bit.  I would not think that her iron level would be low given the MCV.  I think we negative back in 6 months.  I think this would be reasonable.  We are checking her vitamin D level.  I am glad to see that she has lost a lot of weight.  I think this will certainly help her out.Marland Kitchen  [name], MD 2/1/20193:51 PM

## 2020-01-12 ENCOUNTER — Other Ambulatory Visit: Payer: Self-pay

## 2020-01-12 ENCOUNTER — Telehealth: Payer: Self-pay | Admitting: Allergy and Immunology

## 2020-01-12 MED ORDER — FLUOCINOLONE ACETONIDE 0.01 % OT OIL: 5.0000 [drp] | TOPICAL_OIL | Freq: Two times a day (BID) | OTIC | 0 refills | Status: AC | PRN

## 2020-01-12 NOTE — Telephone Encounter (Signed)
Refill sent.

## 2020-01-12 NOTE — Telephone Encounter (Signed)
Per Dr. Pascal Lux to refill medication and sent it to [facility]. Patient will  Notified via mychart.

## 2020-01-12 NOTE — Telephone Encounter (Signed)
Patient is requesting a refill for fluocinolone acetonide. She threw the box and bottle away, so she was not sure of the name of the medication. She did say it was an oil. [facility] [address].

## 2020-02-25 IMAGING — MG DIGITAL SCREENING BILATERAL MAMMOGRAM WITH TOMO AND CAD
8 series · 8 of 24 positions shown · non-contrast
Comparison: Previous exam(s).

CLINICAL DATA: Screening.

EXAM:
DIGITAL SCREENING BILATERAL MAMMOGRAM WITH TOMO AND CAD

[R MLO synth-2D]
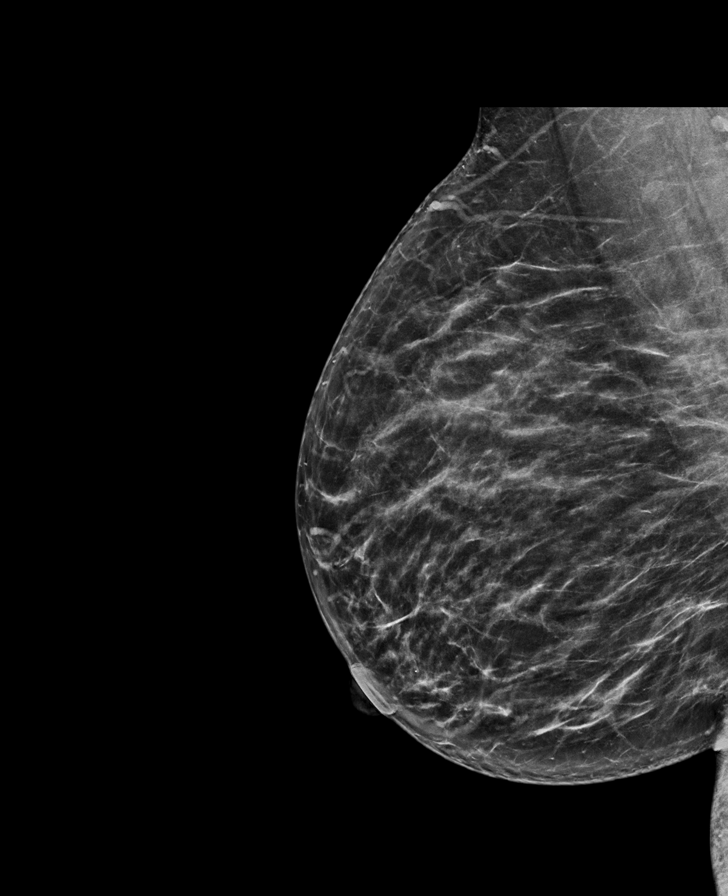

[R CC synth-2D]
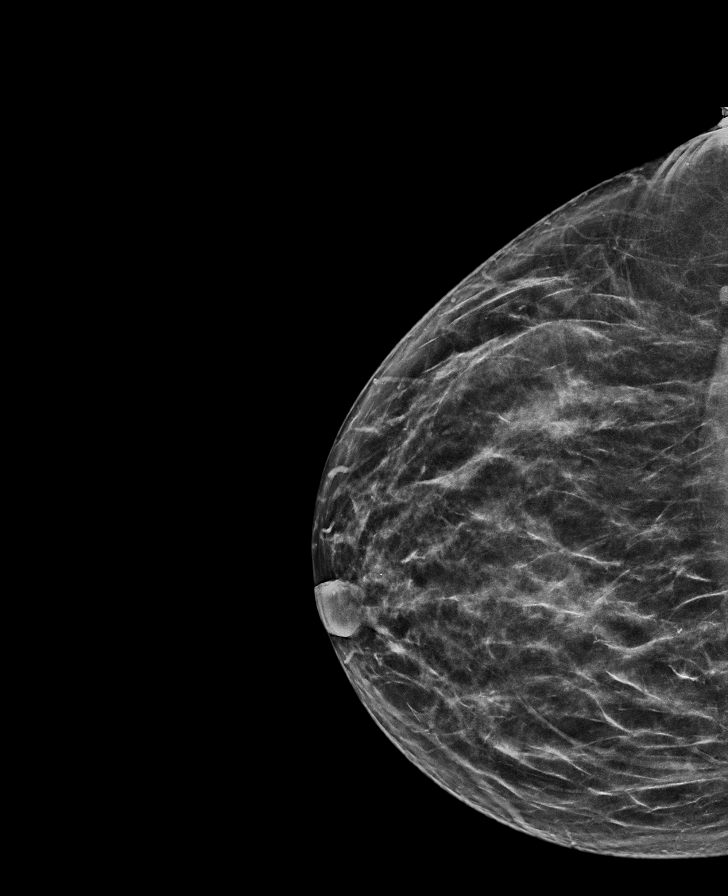

[L CC synth-2D]
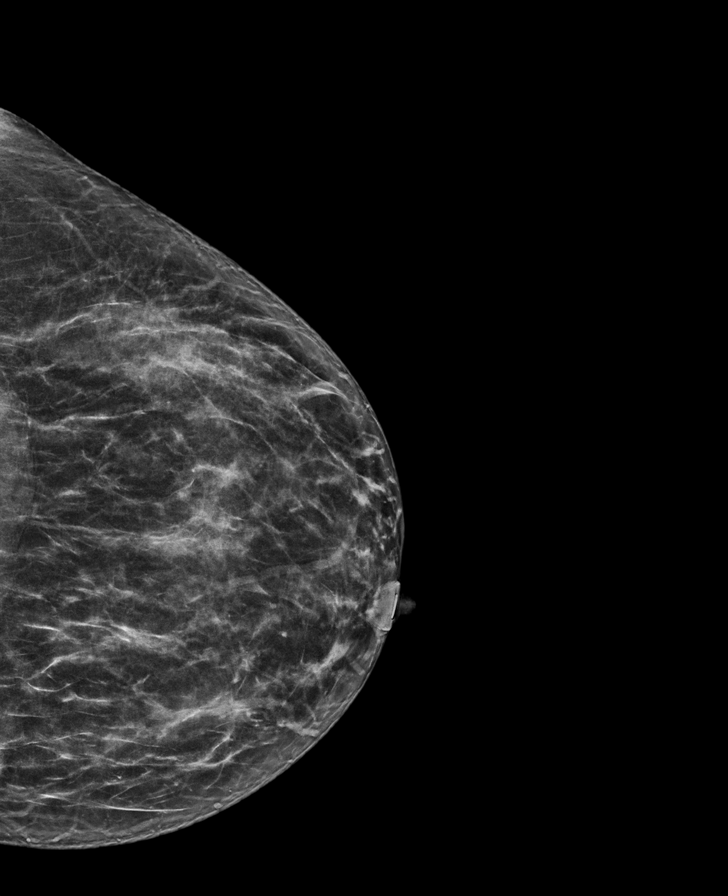

[L MLO synth-2D]
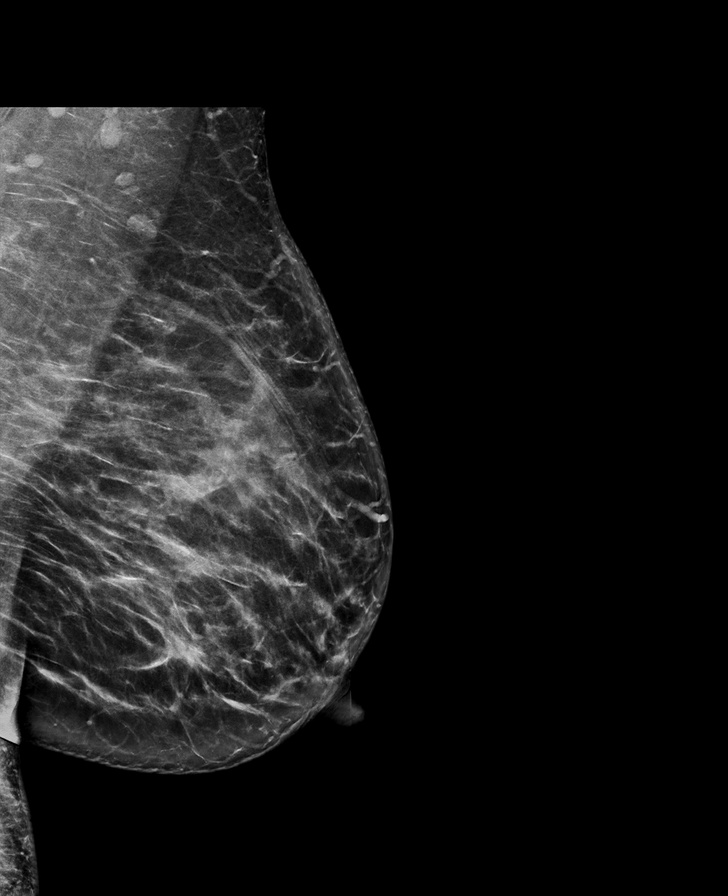

[R CC tomo · tomo slice 35/69.0]
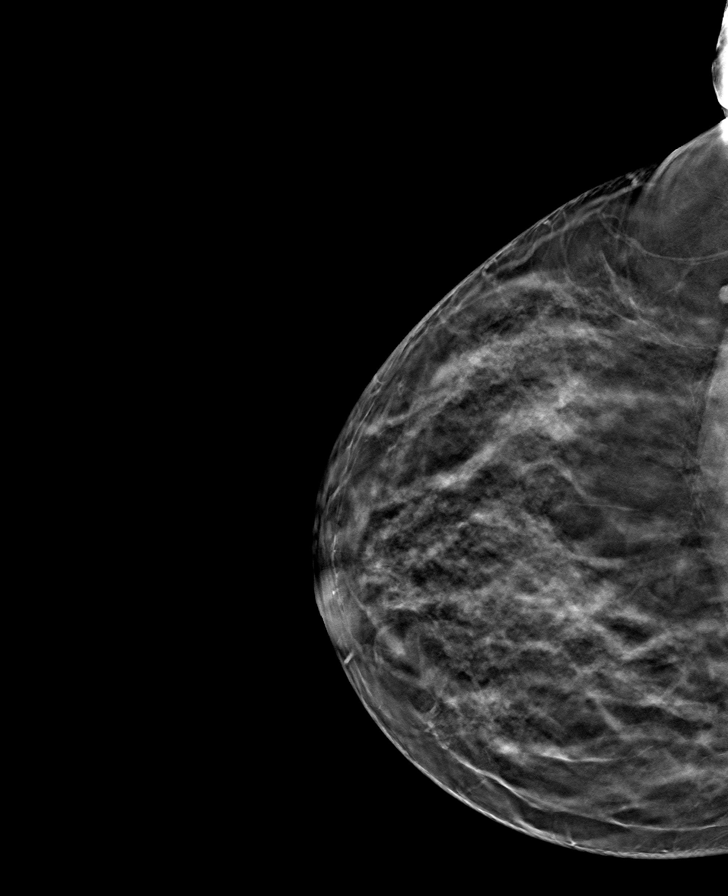

[L MLO tomo · tomo slice 41/81.0]
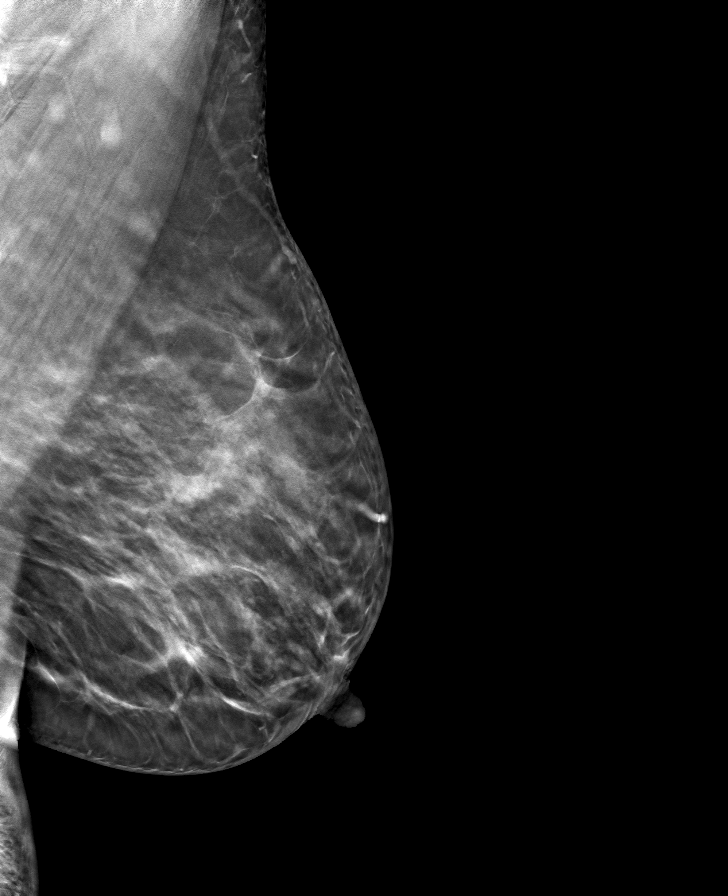

[R MLO tomo · tomo slice 35/70.0]
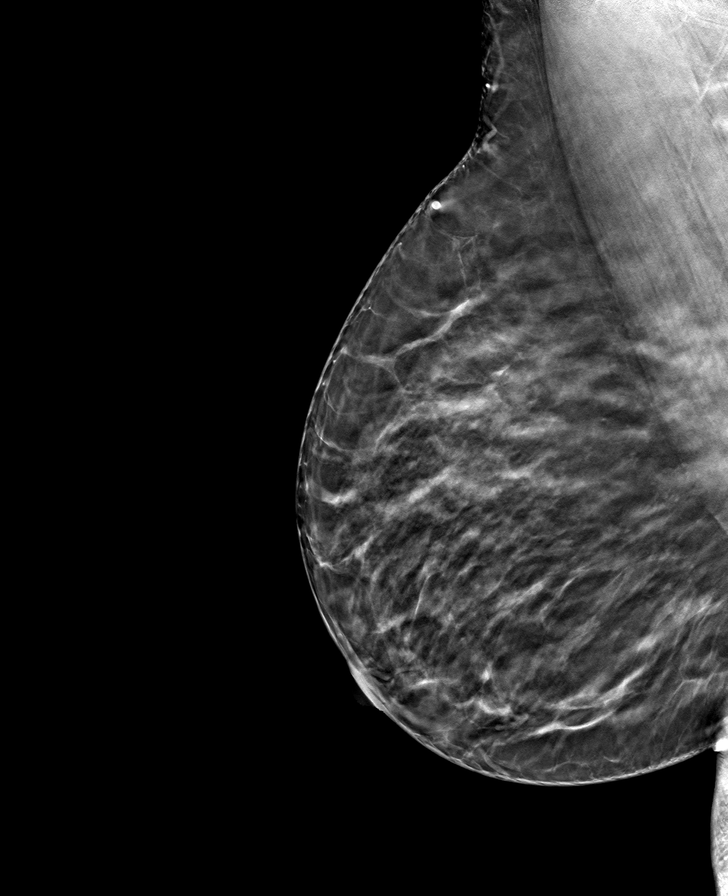

[L CC tomo · tomo slice 33/64.0]
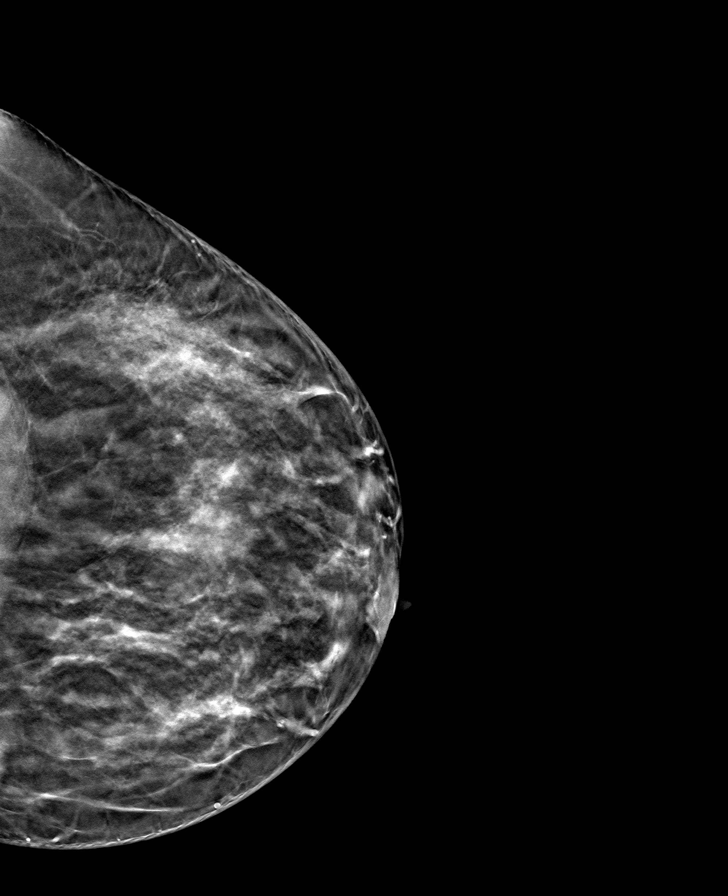

[8 of 24 positions shown; findings below may reference images not displayed]

ACR Breast Density Category c: The breast tissue is heterogeneously
dense, which may obscure small masses.
FINDINGS: There are no findings suspicious for malignancy. Images were
processed with CAD.
IMPRESSION: No mammographic evidence of malignancy. A result letter of this
screening mammogram will be mailed directly to the patient.

RECOMMENDATION:
Screening mammogram in one year. (Code:[id number])

BI-RADS CATEGORY  1: Negative.

## 2020-03-19 ENCOUNTER — Ambulatory Visit: Payer: Federal, State, Local not specified - PPO | Admitting: Allergy and Immunology

## 2020-04-02 ENCOUNTER — Other Ambulatory Visit: Payer: Self-pay | Admitting: *Deleted

## 2020-04-02 MED ORDER — EPINEPHRINE 0.3 MG/0.3ML IJ SOAJ: 0.3000 mg | Freq: Once | INTRAMUSCULAR | 1 refills | Status: AC

## 2020-04-02 MED ORDER — FLOVENT HFA 110 MCG/ACT IN AERO: 2.0000 | INHALATION_SPRAY | Freq: Two times a day (BID) | RESPIRATORY_TRACT | 5 refills | Status: AC

## 2020-04-02 MED ORDER — ALBUTEROL SULFATE HFA 108 (90 BASE) MCG/ACT IN AERS: 2.0000 | INHALATION_SPRAY | RESPIRATORY_TRACT | 1 refills | Status: AC | PRN

## 2020-04-02 MED ORDER — EPINEPHRINE 0.3 MG/0.3ML IJ SOAJ: 0.3000 mg | INTRAMUSCULAR | 1 refills | Status: AC | PRN

## 2020-06-05 ENCOUNTER — Other Ambulatory Visit: Payer: Self-pay | Admitting: Endocrinology

## 2020-06-05 DIAGNOSIS — E049 Nontoxic goiter, unspecified: Secondary | ICD-10-CM

## 2020-06-28 ENCOUNTER — Ambulatory Visit
Admission: RE | Admit: 2020-06-28 | Discharge: 2020-06-28 | Disposition: A | Discharge status: Home / Self Care | Payer: Federal, State, Local not specified - PPO | Source: Ambulatory Visit | Attending: Endocrinology | Admitting: Endocrinology

## 2020-06-28 DIAGNOSIS — E049 Nontoxic goiter, unspecified: Secondary | ICD-10-CM

## 2020-07-05 ENCOUNTER — Inpatient Hospital Stay: Payer: Federal, State, Local not specified - PPO

## 2020-07-05 ENCOUNTER — Inpatient Hospital Stay: Payer: Federal, State, Local not specified - PPO | Admitting: Family

## 2020-07-16 ENCOUNTER — Ambulatory Visit: Payer: Federal, State, Local not specified - PPO | Admitting: Allergy and Immunology

## 2020-08-05 ENCOUNTER — Inpatient Hospital Stay: Payer: Federal, State, Local not specified - PPO | Attending: Family

## 2020-08-05 ENCOUNTER — Inpatient Hospital Stay: Payer: Federal, State, Local not specified - PPO | Admitting: Family

## 2020-09-25 ENCOUNTER — Ambulatory Visit: Payer: Federal, State, Local not specified - PPO | Admitting: Allergy and Immunology

## 2020-10-14 ENCOUNTER — Other Ambulatory Visit: Payer: Self-pay | Admitting: Physician Assistant

## 2020-10-14 DIAGNOSIS — Z1231 Encounter for screening mammogram for malignant neoplasm of breast: Secondary | ICD-10-CM

## 2020-11-24 ENCOUNTER — Other Ambulatory Visit: Payer: Self-pay | Admitting: Allergy and Immunology

## 2020-11-26 ENCOUNTER — Ambulatory Visit: Payer: Federal, State, Local not specified - PPO

## 2020-12-25 ENCOUNTER — Ambulatory Visit: Payer: Federal, State, Local not specified - PPO | Admitting: Allergy and Immunology

## 2021-01-03 ENCOUNTER — Ambulatory Visit
Admission: RE | Admit: 2021-01-03 | Discharge: 2021-01-03 | Disposition: A | Discharge status: Home / Self Care | Payer: Federal, State, Local not specified - PPO | Source: Ambulatory Visit | Attending: Physician Assistant | Admitting: Physician Assistant

## 2021-01-03 ENCOUNTER — Other Ambulatory Visit: Payer: Self-pay

## 2021-01-03 DIAGNOSIS — Z1231 Encounter for screening mammogram for malignant neoplasm of breast: Secondary | ICD-10-CM

## 2021-01-07 NOTE — Progress Notes (Deleted)
Follow Up Note  RE: Nicole Booth MRN: [id number] DOB: 1976-02-26 Date of Office Visit: 01/08/2021  Referring provider: Keane Scrape* Primary care provider: Jonathon Bellows, PA-C  Chief Complaint: No chief complaint on file.  History of Present Illness: I had the pleasure of seeing Nicole Booth for a follow up visit at [facility] on 01/07/2021. She is a 45 y.o. female, who is being followed for asthma and allergic rhinitis. Her previous allergy office visit was on 11/07/2019 with Dr. Verlin Fester. Today is a regular follow up visit.  2020 skin testing positive to grass, mold and dust mite.  Negative to foods.  Asthma with acute exacerbation  Prednisone has been provided, 40 mg x3 days, 20 mg x1 day, 10 mg x1 day, then stop.  A prescription has been provided for Symbicort (budesonide/formoterol) 160/4.5 g, 2 inhalations via spacer device twice a day.  Per the patient's request, a prescription has been provided for albuterol 0.083% solution via nebulizer every 4-6 hours if needed.  The patient has been asked to contact me if these symptoms persist or progress spite the treatment plan as outlined above, otherwise she may return for follow-up in 4 months or sooner if needed.  Perennial and seasonal allergic rhinitis  Continue appropriate allergen avoidance measures, carbinoxamine as needed, and Xhance nasal spray as needed.  Nasal saline lavage (NeilMed) has been recommended as needed and prior to medicated nasal sprays along with instructions for proper administration.  For thick post nasal drainage, add guaifenesin 1200 mg (Mucinex Maximum Strength)  twice daily as needed with adequate hydration as discussed.   Assessment and Plan: Azlynn is a 45 y.o. female with: No problem-specific Assessment & Plan notes found for this encounter.  No follow-ups on file.  No orders of the defined types were placed in this  encounter.  Lab Orders  No laboratory test(s) ordered today    Diagnostics: Spirometry:  Tracings reviewed. Her effort: {Blank single:19197::"Good reproducible efforts.","It was hard to get consistent efforts and there is a question as to whether this reflects a maximal maneuver.","Poor effort, data can not be interpreted."} FVC: ***L FEV1: ***L, ***% predicted FEV1/FVC ratio: ***% Interpretation: {Blank single:19197::"Spirometry consistent with mild obstructive disease","Spirometry consistent with moderate obstructive disease","Spirometry consistent with severe obstructive disease","Spirometry consistent with possible restrictive disease","Spirometry consistent with mixed obstructive and restrictive disease","Spirometry uninterpretable due to technique","Spirometry consistent with normal pattern","No overt abnormalities noted given today's efforts"}.  Please see scanned spirometry results for details.  Skin Testing: {Blank single:19197::"Select foods","Environmental allergy panel","Environmental allergy panel and select foods","Food allergy panel","None","Deferred due to recent antihistamines use"}. Positive test to: ***. Negative test to: ***.  Results discussed with patient/family.   Medication List:  Current Outpatient Medications  Medication Sig Dispense Refill  . acetaminophen (TYLENOL) 500 MG tablet Take by mouth 3 (three) times daily as needed.    Marland Kitchen albuterol (VENTOLIN HFA) 108 (90 Base) MCG/ACT inhaler Inhale 2 puffs into the lungs every 4 (four) hours as needed for wheezing or shortness of breath. 18 g 1  . budesonide-formoterol (SYMBICORT) 160-4.5 MCG/ACT inhaler Inhale 2 puffs into the lungs 2 (two) times daily. 1 Inhaler 5  . Carbinoxamine Maleate 4 MG TABS Take 1 tablet (4 mg total) by mouth every 6 (six) hours as needed. 28 each 5  . Dapsone 5 % topical gel     . DULoxetine (CYMBALTA) 30 MG capsule     . EPINEPHrine (AUVI-Q) 0.3 mg/0.3 mL IJ SOAJ injection Inject 0.3  mLs  (0.3 mg total) into the muscle as needed for anaphylaxis. 2 each 1  . ergocalciferol (VITAMIN D2) 1.25 MG (50000 UT) capsule Take 1 capsule (50,000 Units total) by mouth 3 (three) times a week. 36 capsule 3  . Fluocinolone Acetonide 0.01 % OIL Place 5 drops in ear(s) 2 (two) times daily as needed (2 times per day as needed x 14 days). 20 mL 0  . fluticasone (FLOVENT HFA) 110 MCG/ACT inhaler Inhale 2 puffs into the lungs 2 (two) times daily. 1 Inhaler 5  . Fluticasone Propionate (XHANCE) 93 MCG/ACT EXHU Place 2 sprays into the nose 2 (two) times daily. (Patient not taking: Reported on 23/04/5731) 32 mL 5  . folic acid (FOLVITE) 1 MG tablet Take 1 tablet (1 mg total) by mouth daily. 90 tablet 9  . hyoscyamine (ANASPAZ) 0.125 MG TBDP disintergrating tablet Place 1 tablet (0.125 mg total) under the tongue every 4 (four) hours as needed for bladder spasms. 60 tablet 6  . hyoscyamine (LEVBID) 0.375 MG 12 hr tablet Take 1 tablet (0.375 mg total) by mouth at bedtime. 60 tablet 6  . ibuprofen (ADVIL,MOTRIN) 800 MG tablet Take 1 tablet (800 mg total) by mouth every 8 (eight) hours as needed (mild pain). 30 tablet 1  . ipratropium (ATROVENT) 0.03 % nasal spray     . montelukast (SINGULAIR) 10 MG tablet Take 1 tablet (10 mg total) by mouth at bedtime. 30 tablet 5  . SUMAtriptan (IMITREX) 100 MG tablet Take 100 mg by mouth as needed. May repeat in 2 hours if headache persists or recurs.    . topiramate (TOPAMAX) 100 MG tablet Take by mouth.    . topiramate (TOPAMAX) 25 MG tablet Take 25 mg by mouth daily.    . valACYclovir (VALTREX) 500 MG tablet Take 500 mg by mouth 2 (two) times daily as needed.     No current facility-administered medications for this visit.   Allergies: Allergies  Allergen Reactions  . Penicillins Hives, Rash and Other (See Comments)    Has patient had a PCN reaction causing immediate rash, facial/tongue/throat swelling, SOB or lightheadedness with hypotension: Yes Has patient had a PCN  reaction causing severe rash involving mucus membranes or skin necrosis: No Has patient had a PCN reaction that required hospitalization No Has patient had a PCN reaction occurring within the last 10 years: No If all of the above answers are "NO", then may proceed with Cephalosporin use.   . Lactase Diarrhea and Other (See Comments)    (Dairy Products) IBS w/Diarrhea- upsets stomach Chief Strategy Officer) IBS w/Diarrhea- upsets stomach   . Shellfish Allergy Itching  . Shellfish-Derived Products Hives    Itching  . Latex Itching    Patient states that she does not need a test. Not really an allergy. Has had some itching with gloves but has had surgery before without issues Patient states that she does not need a test. Not really an allergy. Has had some itching with gloves but has had surgery before without issues    I reviewed her past medical history, social history, family history, and environmental history and no significant changes have been reported from her previous visit.  Review of Systems  Constitutional: Negative for appetite change, chills, fever and unexpected weight change.  HENT: Negative for congestion and rhinorrhea.   Eyes: Negative for itching.  Respiratory: Negative for cough, chest tightness, shortness of breath and wheezing.   Gastrointestinal: Negative for abdominal pain.  Skin: Negative for rash.  Allergic/Immunologic:  Positive for environmental allergies.  Neurological: Negative for headaches.   Objective: LMP 07/10/2016  There is no height or weight on file to calculate BMI. Physical Exam Vitals and nursing note reviewed.  Constitutional:      Appearance: Normal appearance. She is well-developed.  HENT:     Head: Normocephalic and atraumatic.     Right Ear: External ear normal.     Left Ear: External ear normal.     Nose: Nose normal.     Mouth/Throat:     Mouth: Mucous membranes are moist.     Pharynx: Oropharynx is clear.  Eyes:      Conjunctiva/sclera: Conjunctivae normal.  Cardiovascular:     Rate and Rhythm: Normal rate and regular rhythm.     Heart sounds: Normal heart sounds. No murmur heard.   Pulmonary:     Effort: Pulmonary effort is normal.     Breath sounds: Normal breath sounds. No wheezing, rhonchi or rales.  Musculoskeletal:     Cervical back: Neck supple.  Skin:    General: Skin is warm.     Findings: No rash.  Neurological:     Mental Status: She is alert and oriented to person, place, and time.  Psychiatric:        Behavior: Behavior normal.    Previous notes and tests were reviewed. The plan was reviewed with the patient/family, and all questions/concerned were addressed.  It was my pleasure to see Nicole Booth today and participate in her care. Please feel free to contact me with any questions or concerns.  Sincerely,  Rexene Alberts, DO Allergy & Immunology  [facility] office: [place] office: [phone]

## 2021-01-08 ENCOUNTER — Ambulatory Visit: Payer: Federal, State, Local not specified - PPO | Admitting: Allergy

## 2021-01-08 DIAGNOSIS — J3089 Other allergic rhinitis: Secondary | ICD-10-CM

## 2021-02-17 ENCOUNTER — Ambulatory Visit: Payer: Federal, State, Local not specified - PPO | Admitting: Allergy

## 2021-06-24 ENCOUNTER — Other Ambulatory Visit: Payer: Self-pay | Admitting: Endocrinology

## 2021-06-24 DIAGNOSIS — E049 Nontoxic goiter, unspecified: Secondary | ICD-10-CM

## 2021-09-10 ENCOUNTER — Other Ambulatory Visit: Payer: Federal, State, Local not specified - PPO

## 2021-09-17 ENCOUNTER — Ambulatory Visit
Admission: RE | Admit: 2021-09-17 | Discharge: 2021-09-17 | Disposition: A | Discharge status: Home / Self Care | Payer: Federal, State, Local not specified - PPO | Source: Ambulatory Visit | Attending: Endocrinology | Admitting: Endocrinology

## 2021-09-17 DIAGNOSIS — E049 Nontoxic goiter, unspecified: Secondary | ICD-10-CM

## 2021-12-15 ENCOUNTER — Other Ambulatory Visit: Payer: Self-pay | Admitting: Internal Medicine

## 2021-12-15 DIAGNOSIS — Z1231 Encounter for screening mammogram for malignant neoplasm of breast: Secondary | ICD-10-CM

## 2022-01-05 ENCOUNTER — Ambulatory Visit
Admission: RE | Admit: 2022-01-05 | Discharge: 2022-01-05 | Disposition: A | Discharge status: Home / Self Care | Payer: Federal, State, Local not specified - PPO | Source: Ambulatory Visit | Attending: Internal Medicine | Admitting: Internal Medicine

## 2022-01-05 DIAGNOSIS — Z1231 Encounter for screening mammogram for malignant neoplasm of breast: Secondary | ICD-10-CM

## 2022-04-20 NOTE — Telephone Encounter (Signed)
Closing note 

## 2022-04-30 DIAGNOSIS — E668 Other obesity: Secondary | ICD-10-CM | POA: Diagnosis not present

## 2022-04-30 DIAGNOSIS — Z7182 Exercise counseling: Secondary | ICD-10-CM | POA: Diagnosis not present

## 2022-04-30 DIAGNOSIS — Z6841 Body Mass Index (BMI) 40.0 and over, adult: Secondary | ICD-10-CM | POA: Diagnosis not present

## 2022-04-30 DIAGNOSIS — Z713 Dietary counseling and surveillance: Secondary | ICD-10-CM | POA: Diagnosis not present

## 2022-05-01 DIAGNOSIS — Z13228 Encounter for screening for other metabolic disorders: Secondary | ICD-10-CM | POA: Diagnosis not present

## 2022-05-14 DIAGNOSIS — R110 Nausea: Secondary | ICD-10-CM | POA: Diagnosis not present

## 2022-05-14 DIAGNOSIS — E668 Other obesity: Secondary | ICD-10-CM | POA: Diagnosis not present

## 2022-05-14 DIAGNOSIS — Z713 Dietary counseling and surveillance: Secondary | ICD-10-CM | POA: Diagnosis not present

## 2022-05-14 DIAGNOSIS — Z6841 Body Mass Index (BMI) 40.0 and over, adult: Secondary | ICD-10-CM | POA: Diagnosis not present

## 2022-05-15 DIAGNOSIS — N289 Disorder of kidney and ureter, unspecified: Secondary | ICD-10-CM | POA: Diagnosis not present

## 2022-05-15 DIAGNOSIS — Z6841 Body Mass Index (BMI) 40.0 and over, adult: Secondary | ICD-10-CM | POA: Diagnosis not present

## 2022-05-19 DIAGNOSIS — N289 Disorder of kidney and ureter, unspecified: Secondary | ICD-10-CM | POA: Diagnosis not present

## 2022-06-03 DIAGNOSIS — E668 Other obesity: Secondary | ICD-10-CM | POA: Diagnosis not present

## 2022-06-03 DIAGNOSIS — E6609 Other obesity due to excess calories: Secondary | ICD-10-CM | POA: Diagnosis not present

## 2022-06-03 DIAGNOSIS — Z6839 Body mass index (BMI) 39.0-39.9, adult: Secondary | ICD-10-CM | POA: Diagnosis not present

## 2022-06-03 DIAGNOSIS — Z6841 Body Mass Index (BMI) 40.0 and over, adult: Secondary | ICD-10-CM | POA: Diagnosis not present

## 2022-06-03 DIAGNOSIS — Z713 Dietary counseling and surveillance: Secondary | ICD-10-CM | POA: Diagnosis not present

## 2022-06-03 DIAGNOSIS — G4489 Other headache syndrome: Secondary | ICD-10-CM | POA: Diagnosis not present

## 2022-06-03 DIAGNOSIS — Z760 Encounter for issue of repeat prescription: Secondary | ICD-10-CM | POA: Diagnosis not present

## 2022-06-16 DIAGNOSIS — K5909 Other constipation: Secondary | ICD-10-CM | POA: Diagnosis not present

## 2022-06-16 DIAGNOSIS — E668 Other obesity: Secondary | ICD-10-CM | POA: Diagnosis not present

## 2022-06-16 DIAGNOSIS — Z6839 Body mass index (BMI) 39.0-39.9, adult: Secondary | ICD-10-CM | POA: Diagnosis not present

## 2022-06-16 DIAGNOSIS — Z7189 Other specified counseling: Secondary | ICD-10-CM | POA: Diagnosis not present

## 2022-06-30 DIAGNOSIS — K219 Gastro-esophageal reflux disease without esophagitis: Secondary | ICD-10-CM | POA: Diagnosis not present

## 2022-06-30 DIAGNOSIS — E668 Other obesity: Secondary | ICD-10-CM | POA: Diagnosis not present

## 2022-06-30 DIAGNOSIS — Z6839 Body mass index (BMI) 39.0-39.9, adult: Secondary | ICD-10-CM | POA: Diagnosis not present

## 2022-06-30 DIAGNOSIS — Z760 Encounter for issue of repeat prescription: Secondary | ICD-10-CM | POA: Diagnosis not present

## 2022-08-06 DIAGNOSIS — E668 Other obesity: Secondary | ICD-10-CM | POA: Diagnosis not present

## 2022-08-06 DIAGNOSIS — Z6837 Body mass index (BMI) 37.0-37.9, adult: Secondary | ICD-10-CM | POA: Diagnosis not present

## 2022-08-06 DIAGNOSIS — K219 Gastro-esophageal reflux disease without esophagitis: Secondary | ICD-10-CM | POA: Diagnosis not present

## 2022-08-13 DIAGNOSIS — N181 Chronic kidney disease, stage 1: Secondary | ICD-10-CM | POA: Diagnosis not present

## 2022-08-13 DIAGNOSIS — N289 Disorder of kidney and ureter, unspecified: Secondary | ICD-10-CM | POA: Diagnosis not present

## 2022-08-24 DIAGNOSIS — E039 Hypothyroidism, unspecified: Secondary | ICD-10-CM | POA: Diagnosis not present

## 2022-08-24 DIAGNOSIS — E049 Nontoxic goiter, unspecified: Secondary | ICD-10-CM | POA: Diagnosis not present

## 2022-08-24 DIAGNOSIS — E559 Vitamin D deficiency, unspecified: Secondary | ICD-10-CM | POA: Diagnosis not present

## 2022-09-07 DIAGNOSIS — N898 Other specified noninflammatory disorders of vagina: Secondary | ICD-10-CM | POA: Diagnosis not present

## 2022-09-07 DIAGNOSIS — N760 Acute vaginitis: Secondary | ICD-10-CM | POA: Diagnosis not present

## 2022-09-07 DIAGNOSIS — A6009 Herpesviral infection of other urogenital tract: Secondary | ICD-10-CM | POA: Diagnosis not present

## 2022-09-08 DIAGNOSIS — R635 Abnormal weight gain: Secondary | ICD-10-CM | POA: Diagnosis not present

## 2022-09-08 DIAGNOSIS — E559 Vitamin D deficiency, unspecified: Secondary | ICD-10-CM | POA: Diagnosis not present

## 2022-09-08 DIAGNOSIS — E049 Nontoxic goiter, unspecified: Secondary | ICD-10-CM | POA: Diagnosis not present

## 2022-09-09 ENCOUNTER — Other Ambulatory Visit: Payer: Self-pay | Admitting: Endocrinology

## 2022-09-09 DIAGNOSIS — E049 Nontoxic goiter, unspecified: Secondary | ICD-10-CM

## 2022-09-14 DIAGNOSIS — Z760 Encounter for issue of repeat prescription: Secondary | ICD-10-CM | POA: Diagnosis not present

## 2022-09-14 DIAGNOSIS — Z6835 Body mass index (BMI) 35.0-35.9, adult: Secondary | ICD-10-CM | POA: Diagnosis not present

## 2022-09-14 DIAGNOSIS — E668 Other obesity: Secondary | ICD-10-CM | POA: Diagnosis not present

## 2022-09-14 DIAGNOSIS — K5903 Drug induced constipation: Secondary | ICD-10-CM | POA: Diagnosis not present

## 2022-09-15 ENCOUNTER — Ambulatory Visit
Admission: RE | Admit: 2022-09-15 | Discharge: 2022-09-15 | Disposition: A | Discharge status: Home / Self Care | Payer: Federal, State, Local not specified - PPO | Source: Ambulatory Visit | Attending: Endocrinology | Admitting: Endocrinology

## 2022-09-15 DIAGNOSIS — E049 Nontoxic goiter, unspecified: Secondary | ICD-10-CM

## 2022-09-15 DIAGNOSIS — E041 Nontoxic single thyroid nodule: Secondary | ICD-10-CM | POA: Diagnosis not present

## 2022-10-14 DIAGNOSIS — L700 Acne vulgaris: Secondary | ICD-10-CM | POA: Diagnosis not present

## 2022-10-14 DIAGNOSIS — L2089 Other atopic dermatitis: Secondary | ICD-10-CM | POA: Diagnosis not present

## 2022-10-16 DIAGNOSIS — Z23 Encounter for immunization: Secondary | ICD-10-CM | POA: Diagnosis not present

## 2022-10-16 DIAGNOSIS — Z0000 Encounter for general adult medical examination without abnormal findings: Secondary | ICD-10-CM | POA: Diagnosis not present

## 2022-10-26 DIAGNOSIS — Z713 Dietary counseling and surveillance: Secondary | ICD-10-CM | POA: Diagnosis not present

## 2022-10-26 DIAGNOSIS — Z6835 Body mass index (BMI) 35.0-35.9, adult: Secondary | ICD-10-CM | POA: Diagnosis not present

## 2022-10-26 DIAGNOSIS — E6609 Other obesity due to excess calories: Secondary | ICD-10-CM | POA: Diagnosis not present

## 2022-10-26 DIAGNOSIS — F419 Anxiety disorder, unspecified: Secondary | ICD-10-CM | POA: Diagnosis not present

## 2022-10-26 DIAGNOSIS — E668 Other obesity: Secondary | ICD-10-CM | POA: Diagnosis not present

## 2022-11-23 ENCOUNTER — Other Ambulatory Visit: Payer: Self-pay | Admitting: Physician Assistant

## 2022-11-23 DIAGNOSIS — Z1231 Encounter for screening mammogram for malignant neoplasm of breast: Secondary | ICD-10-CM

## 2022-11-24 DIAGNOSIS — Z6835 Body mass index (BMI) 35.0-35.9, adult: Secondary | ICD-10-CM | POA: Diagnosis not present

## 2022-11-24 DIAGNOSIS — M797 Fibromyalgia: Secondary | ICD-10-CM | POA: Diagnosis not present

## 2022-11-24 DIAGNOSIS — Z760 Encounter for issue of repeat prescription: Secondary | ICD-10-CM | POA: Diagnosis not present

## 2022-11-24 DIAGNOSIS — E668 Other obesity: Secondary | ICD-10-CM | POA: Diagnosis not present

## 2022-11-26 DIAGNOSIS — G894 Chronic pain syndrome: Secondary | ICD-10-CM | POA: Diagnosis not present

## 2022-11-26 DIAGNOSIS — M5030 Other cervical disc degeneration, unspecified cervical region: Secondary | ICD-10-CM | POA: Diagnosis not present

## 2022-11-26 DIAGNOSIS — M5136 Other intervertebral disc degeneration, lumbar region: Secondary | ICD-10-CM | POA: Diagnosis not present

## 2022-12-02 DIAGNOSIS — R5383 Other fatigue: Secondary | ICD-10-CM | POA: Diagnosis not present

## 2022-12-02 DIAGNOSIS — Z1152 Encounter for screening for COVID-19: Secondary | ICD-10-CM | POA: Diagnosis not present

## 2022-12-02 DIAGNOSIS — R519 Headache, unspecified: Secondary | ICD-10-CM | POA: Diagnosis not present

## 2022-12-02 DIAGNOSIS — J029 Acute pharyngitis, unspecified: Secondary | ICD-10-CM | POA: Diagnosis not present

## 2022-12-02 DIAGNOSIS — R6883 Chills (without fever): Secondary | ICD-10-CM | POA: Diagnosis not present

## 2022-12-02 DIAGNOSIS — J069 Acute upper respiratory infection, unspecified: Secondary | ICD-10-CM | POA: Diagnosis not present

## 2022-12-02 DIAGNOSIS — R059 Cough, unspecified: Secondary | ICD-10-CM | POA: Diagnosis not present

## 2022-12-02 DIAGNOSIS — Z20822 Contact with and (suspected) exposure to covid-19: Secondary | ICD-10-CM | POA: Diagnosis not present

## 2022-12-21 DIAGNOSIS — E049 Nontoxic goiter, unspecified: Secondary | ICD-10-CM | POA: Diagnosis not present

## 2022-12-21 DIAGNOSIS — R635 Abnormal weight gain: Secondary | ICD-10-CM | POA: Diagnosis not present

## 2022-12-21 DIAGNOSIS — E559 Vitamin D deficiency, unspecified: Secondary | ICD-10-CM | POA: Diagnosis not present

## 2023-01-01 ENCOUNTER — Other Ambulatory Visit: Payer: Self-pay | Admitting: Family

## 2023-01-01 DIAGNOSIS — R7989 Other specified abnormal findings of blood chemistry: Secondary | ICD-10-CM

## 2023-01-04 ENCOUNTER — Inpatient Hospital Stay (HOSPITAL_BASED_OUTPATIENT_CLINIC_OR_DEPARTMENT_OTHER): Payer: Federal, State, Local not specified - PPO | Admitting: Family

## 2023-01-04 ENCOUNTER — Encounter: Payer: Self-pay | Admitting: Family

## 2023-01-04 ENCOUNTER — Inpatient Hospital Stay: Payer: Federal, State, Local not specified - PPO | Attending: Hematology & Oncology

## 2023-01-04 ENCOUNTER — Other Ambulatory Visit: Payer: Self-pay

## 2023-01-04 DIAGNOSIS — F5089 Other specified eating disorder: Secondary | ICD-10-CM | POA: Insufficient documentation

## 2023-01-04 DIAGNOSIS — R6883 Chills (without fever): Secondary | ICD-10-CM | POA: Diagnosis not present

## 2023-01-04 DIAGNOSIS — D509 Iron deficiency anemia, unspecified: Secondary | ICD-10-CM

## 2023-01-04 DIAGNOSIS — M5116 Intervertebral disc disorders with radiculopathy, lumbar region: Secondary | ICD-10-CM | POA: Diagnosis not present

## 2023-01-04 DIAGNOSIS — R0602 Shortness of breath: Secondary | ICD-10-CM | POA: Insufficient documentation

## 2023-01-04 DIAGNOSIS — G8929 Other chronic pain: Secondary | ICD-10-CM | POA: Diagnosis not present

## 2023-01-04 DIAGNOSIS — R7989 Other specified abnormal findings of blood chemistry: Secondary | ICD-10-CM | POA: Insufficient documentation

## 2023-01-04 DIAGNOSIS — M5010 Cervical disc disorder with radiculopathy, unspecified cervical region: Secondary | ICD-10-CM | POA: Insufficient documentation

## 2023-01-04 DIAGNOSIS — R5383 Other fatigue: Secondary | ICD-10-CM | POA: Insufficient documentation

## 2023-01-04 DIAGNOSIS — L819 Disorder of pigmentation, unspecified: Secondary | ICD-10-CM | POA: Diagnosis not present

## 2023-01-04 LAB — CBC WITH DIFFERENTIAL (CANCER CENTER ONLY)
Abs Immature Granulocytes: 0.08 10*3/uL — ABNORMAL HIGH (ref 0.00–0.07)
Basophils Absolute: 0.1 10*3/uL (ref 0.0–0.1)
Basophils Relative: 1 %
Eosinophils Absolute: 0.2 10*3/uL (ref 0.0–0.5)
Eosinophils Relative: 3 %
HCT: 43.7 % (ref 36.0–46.0)
Hemoglobin: 14.3 g/dL (ref 12.0–15.0)
Immature Granulocytes: 1 %
Lymphocytes Relative: 26 %
Lymphs Abs: 1.8 10*3/uL (ref 0.7–4.0)
MCH: 29.5 pg (ref 26.0–34.0)
MCHC: 32.7 g/dL (ref 30.0–36.0)
MCV: 90.1 fL (ref 80.0–100.0)
Monocytes Absolute: 0.4 10*3/uL (ref 0.1–1.0)
Monocytes Relative: 6 %
Neutro Abs: 4.5 10*3/uL (ref 1.7–7.7)
Neutrophils Relative %: 63 %
Platelet Count: 271 10*3/uL (ref 150–400)
RBC: 4.85 MIL/uL (ref 3.87–5.11)
RDW: 12.7 % (ref 11.5–15.5)
WBC Count: 7 10*3/uL (ref 4.0–10.5)
nRBC: 0 % (ref 0.0–0.2)

## 2023-01-04 LAB — CMP (CANCER CENTER ONLY)
ALT: <5 U/L (ref 0–44)
AST: 11 U/L — ABNORMAL LOW (ref 15–41)
Albumin: 4.2 g/dL (ref 3.5–5.0)
Alkaline Phosphatase: 51 U/L (ref 38–126)
Anion gap: 9 (ref 5–15)
BUN: 12 mg/dL (ref 6–20)
CO2: 29 mmol/L (ref 22–32)
Calcium: 9.2 mg/dL (ref 8.9–10.3)
Chloride: 104 mmol/L (ref 98–111)
Creatinine: 1.14 mg/dL — ABNORMAL HIGH (ref 0.44–1.00)
GFR, Estimated: 60 mL/min — ABNORMAL LOW (ref >60)
Glucose, Bld: 94 mg/dL (ref 70–99)
Potassium: 3.9 mmol/L (ref 3.5–5.1)
Sodium: 142 mmol/L (ref 135–145)
Total Bilirubin: 0.4 mg/dL (ref 0.3–1.2)
Total Protein: 7 g/dL (ref 6.5–8.1)

## 2023-01-04 LAB — RETICULOCYTES
Immature Retic Fract: 11.4 % (ref 2.3–15.9)
RBC.: 4.87 MIL/uL (ref 3.87–5.11)
Retic Count, Absolute: 89.6 10*3/uL (ref 19.0–186.0)
Retic Ct Pct: 1.8 % (ref 0.4–3.1)

## 2023-01-04 LAB — FERRITIN: Ferritin: 185 ng/mL (ref 11–307)

## 2023-01-04 NOTE — Progress Notes (Signed)
Hematology and Oncology Follow Up Visit  Nicole Booth [id number] 06/13/1976 47 y.o. 01/04/2023   Principle Diagnosis:  Iron deficiency anemia    Current Therapy:        Status post hysterectomy IV iron as indicated  Folic acid 1 mg po q day   Interim History:  Nicole Booth is here today to re-establish care for elevated ferritin 292. Her history is iron deficiency and this may in fact be reactive. Hemochromatosis DNA sent and iron studies redrawn with results pending to confirm.  She is symptomatic with fatigue, hyperpigmentation of her lips, SOB and chest discomfort once over this past weekend, craving ice and chills.   She has not noted any blood loss. No bruising or petechiae.  She states that she is now on Wegovy and her appetite comes and goes. She is doing her best to stay well hydrated. Her weight is stable at 186 lbs.  No fever, n/v, cough, rash, dizziness, palpitations, abdominal pain or changes in bowel or bladder habits.  She states that she has chronic pain secondary to DDD and radiculopathy.  No swelling, numbness or tingling in her extremities.  No falls or syncope.   ECOG Performance Status: 1 - Symptomatic but completely ambulatory  Medications:  Allergies as of 01/04/2023       Reactions   Penicillins Hives, Rash, Other (See Comments)   Has patient had a PCN reaction causing immediate rash, facial/tongue/throat swelling, SOB or lightheadedness with hypotension: Yes Has patient had a PCN reaction causing severe rash involving mucus membranes or skin necrosis: No Has patient had a PCN reaction that required hospitalization No Has patient had a PCN reaction occurring within the last 10 years: No If all of the above answers are "NO", then may proceed with Cephalosporin use.   Shellfish Allergy Itching   Shellfish-derived Products Hives   Itching   Tilactase Diarrhea, Other (See Comments)   Chief Strategy Officer) IBS w/Diarrhea- upsets stomach Clinical biochemist) IBS w/Diarrhea- upsets stomach   Latex Itching   Patient states that she does not need a test. Not really an allergy. Has had some itching with gloves but has had surgery before without issues Patient states that she does not need a test. Not really an allergy. Has had some itching with gloves but has had surgery before without issues        Medication List        Accurate as of January 04, 2023  3:00 PM. If you have any questions, ask your nurse or doctor.          acetaminophen 500 MG tablet Commonly known as: TYLENOL Take by mouth 3 (three) times daily as needed.   albuterol 108 (90 Base) MCG/ACT inhaler Commonly known as: VENTOLIN HFA Inhale 2 puffs into the lungs every 4 (four) hours as needed for wheezing or shortness of breath.   budesonide-formoterol 160-4.5 MCG/ACT inhaler Commonly known as: Symbicort Inhale 2 puffs into the lungs 2 (two) times daily.   Carbinoxamine Maleate 4 MG Tabs Take 1 tablet (4 mg total) by mouth every 6 (six) hours as needed.   Dapsone 5 % topical gel   DULoxetine 30 MG capsule Commonly known as: CYMBALTA   DULOXETINE HCL PO TAKE THREE CAPSULES BY MOUTH AT BEDTIME FOR MOOD AND PAIN   EPINEPHrine 0.3 mg/0.3 mL Soaj injection Commonly known as: Auvi-Q Inject 0.3 mLs (0.3 mg total) into the muscle as needed for anaphylaxis.   ergocalciferol 1.25 MG (50000 UT) capsule Commonly  known as: VITAMIN D2 Take 1 capsule (50,000 Units total) by mouth 3 (three) times a week.   Flovent HFA 110 MCG/ACT inhaler Generic drug: fluticasone Inhale 2 puffs into the lungs 2 (two) times daily.   Fluocinolone Acetonide 0.01 % Oil Place 5 drops in ear(s) 2 (two) times daily as needed (2 times per day as needed x 14 days).   Fluticasone Propionate 93 MCG/ACT Exhu Commonly known as: Xhance Place 2 sprays into the nose 2 (two) times daily.   folic acid 1 MG tablet Commonly known as: FOLVITE Take 1 tablet (1 mg total) by mouth daily.    hyoscyamine 0.375 MG 12 hr tablet Commonly known as: LEVBID Take 1 tablet (0.375 mg total) by mouth at bedtime.   hyoscyamine 0.125 MG Tbdp disintergrating tablet Commonly known as: ANASPAZ Place 1 tablet (0.125 mg total) under the tongue every 4 (four) hours as needed for bladder spasms.   ibuprofen 800 MG tablet Commonly known as: ADVIL Take 1 tablet (800 mg total) by mouth every 8 (eight) hours as needed (mild pain).   ipratropium 0.03 % nasal spray Commonly known as: ATROVENT   montelukast 10 MG tablet Commonly known as: SINGULAIR Take 1 tablet (10 mg total) by mouth at bedtime.   SUMAtriptan 100 MG tablet Commonly known as: IMITREX Take 100 mg by mouth as needed. May repeat in 2 hours if headache persists or recurs.   topiramate 100 MG tablet Commonly known as: TOPAMAX Take by mouth.   topiramate 25 MG tablet Commonly known as: TOPAMAX Take 25 mg by mouth daily.   valACYclovir 500 MG tablet Commonly known as: VALTREX Take 500 mg by mouth 2 (two) times daily as needed.        Allergies:  Allergies  Allergen Reactions   Penicillins Hives, Rash and Other (See Comments)    Has patient had a PCN reaction causing immediate rash, facial/tongue/throat swelling, SOB or lightheadedness with hypotension: Yes Has patient had a PCN reaction causing severe rash involving mucus membranes or skin necrosis: No Has patient had a PCN reaction that required hospitalization No Has patient had a PCN reaction occurring within the last 10 years: No If all of the above answers are "NO", then may proceed with Cephalosporin use.    Shellfish Allergy Itching   Shellfish-Derived Products Hives    Itching   Tilactase Diarrhea and Other (See Comments)    Chief Strategy Officer) IBS w/Diarrhea- upsets stomach Chief Strategy Officer) IBS w/Diarrhea- upsets stomach    Latex Itching    Patient states that she does not need a test. Not really an allergy. Has had some itching with gloves but has had  surgery before without issues Patient states that she does not need a test. Not really an allergy. Has had some itching with gloves but has had surgery before without issues     Past Medical History, Surgical history, Social history, and Family History were reviewed and updated.  Review of Systems: All other 10 point review of systems is negative.   Physical Exam:  vitals were not taken for this visit.   Wt Readings from Last 3 Encounters:  12/07/19 198 lb (89.8 kg)  07/06/19 191 lb (86.6 kg)  06/27/19 192 lb 12.8 oz (87.5 kg)    Ocular: Sclerae unicteric, pupils equal, round and reactive to light Ear-nose-throat: Oropharynx clear, dentition fair Lymphatic: No cervical or supraclavicular adenopathy Lungs no rales or rhonchi, good excursion bilaterally Heart regular rate and rhythm, no murmur appreciated Abd soft, nontender,  positive bowel sounds MSK no focal spinal tenderness, no joint edema Neuro: non-focal, well-oriented, appropriate affect Breasts: Deferred   Lab Results  Component Value Date   WBC 7.0 01/04/2023   HGB 14.3 01/04/2023   HCT 43.7 01/04/2023   MCV 90.1 01/04/2023   PLT 271 01/04/2023   Lab Results  Component Value Date   FERRITIN 127 12/07/2019   IRON 62 12/07/2019   TIBC 291 12/07/2019   UIBC 229 12/07/2019   IRONPCTSAT 21 12/07/2019   Lab Results  Component Value Date   RETICCTPCT 1.8 01/04/2023   RBC 4.87 01/04/2023   RETICCTABS 62.9 09/16/2015   No results found for: "KPAFRELGTCHN", "LAMBDASER", "KAPLAMBRATIO" No results found for: "IGGSERUM", "IGA", "IGMSERUM" No results found for: "TOTALPROTELP", "ALBUMINELP", "A1GS", "A2GS", "BETS", "BETA2SER", "GAMS", "MSPIKE", "SPEI"   Chemistry      Component Value Date/Time   NA 140 12/07/2019 0747   NA 142 01/23/2019 1404   NA 137 12/15/2016 1050   K 3.6 12/07/2019 0747   K 3.6 01/22/2017 1341   K 4.2 12/15/2016 1050   CL 108 12/07/2019 0747   CL 104 01/22/2017 1341   CO2 26 12/07/2019  0747   CO2 23 01/22/2017 1341   CO2 26 12/15/2016 1050   BUN 13 12/07/2019 0747   BUN 10 01/23/2019 1404   BUN 11.1 12/15/2016 1050   CREATININE 1.08 (H) 12/07/2019 0747   CREATININE 1.07 (H) 01/22/2017 1341   CREATININE 0.9 12/15/2016 1050      Component Value Date/Time   CALCIUM 9.2 12/07/2019 0747   CALCIUM 9.9 01/22/2017 1341   CALCIUM 9.1 12/15/2016 1050   ALKPHOS 55 12/07/2019 0747   ALKPHOS 60 01/22/2017 1341   ALKPHOS 66 12/15/2016 1050   AST 12 (L) 12/07/2019 0747   AST 15 12/15/2016 1050   ALT 6 12/07/2019 0747   ALT <6 12/15/2016 1050   BILITOT 0.5 12/07/2019 0747   BILITOT 0.45 12/15/2016 1050       Impression and Plan: Nicole Booth 47 yo African American female with history of iron deficiency anemia and most recently her endocrinologist became concerned with her ferritin of 292.  She is symptomatic as mentioned above.  Iron studies are pending. We can replace if needed.  Hemochromatosis DNA pending.  Follow-up in 3 months.   Lottie Dawson, NP 1/29/20243:00 PM

## 2023-01-05 LAB — IRON AND IRON BINDING CAPACITY (CC-WL,HP ONLY)
Iron: 69 ug/dL (ref 28–170)
Saturation Ratios: 24 % (ref 10.4–31.8)
TIBC: 293 ug/dL (ref 250–450)
UIBC: 224 ug/dL (ref 148–442)

## 2023-01-08 ENCOUNTER — Other Ambulatory Visit: Payer: Self-pay

## 2023-01-08 ENCOUNTER — Encounter: Payer: Self-pay | Admitting: Family

## 2023-01-08 DIAGNOSIS — D509 Iron deficiency anemia, unspecified: Secondary | ICD-10-CM

## 2023-01-11 ENCOUNTER — Encounter: Payer: Self-pay | Admitting: *Deleted

## 2023-01-11 LAB — HEMOCHROMATOSIS DNA-PCR(C282Y,H63D)

## 2023-01-15 ENCOUNTER — Ambulatory Visit
Admission: RE | Admit: 2023-01-15 | Discharge: 2023-01-15 | Disposition: A | Discharge status: Home / Self Care | Payer: Federal, State, Local not specified - PPO | Source: Ambulatory Visit | Attending: Physician Assistant | Admitting: Physician Assistant

## 2023-01-15 DIAGNOSIS — Z1231 Encounter for screening mammogram for malignant neoplasm of breast: Secondary | ICD-10-CM | POA: Diagnosis not present

## 2023-01-19 DIAGNOSIS — E668 Other obesity: Secondary | ICD-10-CM | POA: Diagnosis not present

## 2023-01-19 DIAGNOSIS — Z6833 Body mass index (BMI) 33.0-33.9, adult: Secondary | ICD-10-CM | POA: Diagnosis not present

## 2023-01-19 DIAGNOSIS — F5112 Insufficient sleep syndrome: Secondary | ICD-10-CM | POA: Diagnosis not present

## 2023-01-19 DIAGNOSIS — Z760 Encounter for issue of repeat prescription: Secondary | ICD-10-CM | POA: Diagnosis not present

## 2023-01-20 DIAGNOSIS — L811 Chloasma: Secondary | ICD-10-CM | POA: Diagnosis not present

## 2023-01-20 DIAGNOSIS — L209 Atopic dermatitis, unspecified: Secondary | ICD-10-CM | POA: Diagnosis not present

## 2023-01-20 DIAGNOSIS — L700 Acne vulgaris: Secondary | ICD-10-CM | POA: Diagnosis not present

## 2023-01-20 DIAGNOSIS — L2089 Other atopic dermatitis: Secondary | ICD-10-CM | POA: Diagnosis not present

## 2023-01-25 DIAGNOSIS — N898 Other specified noninflammatory disorders of vagina: Secondary | ICD-10-CM | POA: Diagnosis not present

## 2023-01-25 DIAGNOSIS — N760 Acute vaginitis: Secondary | ICD-10-CM | POA: Diagnosis not present

## 2023-01-25 DIAGNOSIS — R232 Flushing: Secondary | ICD-10-CM | POA: Diagnosis not present

## 2023-01-25 DIAGNOSIS — B009 Herpesviral infection, unspecified: Secondary | ICD-10-CM | POA: Diagnosis not present

## 2023-01-25 DIAGNOSIS — Z01419 Encounter for gynecological examination (general) (routine) without abnormal findings: Secondary | ICD-10-CM | POA: Diagnosis not present

## 2023-02-11 DIAGNOSIS — Z0120 Encounter for dental examination and cleaning without abnormal findings: Secondary | ICD-10-CM | POA: Diagnosis not present

## 2023-02-22 DIAGNOSIS — Z760 Encounter for issue of repeat prescription: Secondary | ICD-10-CM | POA: Diagnosis not present

## 2023-02-22 DIAGNOSIS — M797 Fibromyalgia: Secondary | ICD-10-CM | POA: Diagnosis not present

## 2023-02-22 DIAGNOSIS — Z6831 Body mass index (BMI) 31.0-31.9, adult: Secondary | ICD-10-CM | POA: Diagnosis not present

## 2023-02-22 DIAGNOSIS — E668 Other obesity: Secondary | ICD-10-CM | POA: Diagnosis not present

## 2023-03-01 DIAGNOSIS — R109 Unspecified abdominal pain: Secondary | ICD-10-CM | POA: Diagnosis not present

## 2023-03-01 DIAGNOSIS — K219 Gastro-esophageal reflux disease without esophagitis: Secondary | ICD-10-CM | POA: Diagnosis not present

## 2023-03-01 DIAGNOSIS — K580 Irritable bowel syndrome with diarrhea: Secondary | ICD-10-CM | POA: Diagnosis not present

## 2023-03-02 DIAGNOSIS — K023 Arrested dental caries: Secondary | ICD-10-CM | POA: Diagnosis not present

## 2023-03-02 DIAGNOSIS — K0510 Chronic gingivitis, plaque induced: Secondary | ICD-10-CM | POA: Diagnosis not present

## 2023-03-02 DIAGNOSIS — K036 Deposits [accretions] on teeth: Secondary | ICD-10-CM | POA: Diagnosis not present

## 2023-04-05 ENCOUNTER — Other Ambulatory Visit: Payer: Federal, State, Local not specified - PPO

## 2023-04-05 ENCOUNTER — Ambulatory Visit: Payer: Federal, State, Local not specified - PPO | Admitting: Family

## 2023-04-05 DIAGNOSIS — Z760 Encounter for issue of repeat prescription: Secondary | ICD-10-CM | POA: Diagnosis not present

## 2023-04-05 DIAGNOSIS — Z6831 Body mass index (BMI) 31.0-31.9, adult: Secondary | ICD-10-CM | POA: Diagnosis not present

## 2023-04-05 DIAGNOSIS — F439 Reaction to severe stress, unspecified: Secondary | ICD-10-CM | POA: Diagnosis not present

## 2023-04-05 DIAGNOSIS — E668 Other obesity: Secondary | ICD-10-CM | POA: Diagnosis not present

## 2023-04-07 ENCOUNTER — Ambulatory Visit: Payer: Federal, State, Local not specified - PPO | Admitting: Family

## 2023-04-07 ENCOUNTER — Other Ambulatory Visit: Payer: Federal, State, Local not specified - PPO

## 2023-04-22 DIAGNOSIS — K08409 Partial loss of teeth, unspecified cause, unspecified class: Secondary | ICD-10-CM | POA: Diagnosis not present

## 2023-04-27 DIAGNOSIS — R109 Unspecified abdominal pain: Secondary | ICD-10-CM | POA: Diagnosis not present

## 2023-05-04 DIAGNOSIS — K08409 Partial loss of teeth, unspecified cause, unspecified class: Secondary | ICD-10-CM | POA: Diagnosis not present

## 2023-05-05 DIAGNOSIS — K08409 Partial loss of teeth, unspecified cause, unspecified class: Secondary | ICD-10-CM | POA: Diagnosis not present

## 2023-05-10 DIAGNOSIS — N3946 Mixed incontinence: Secondary | ICD-10-CM | POA: Diagnosis not present

## 2023-05-10 DIAGNOSIS — Z8742 Personal history of other diseases of the female genital tract: Secondary | ICD-10-CM | POA: Diagnosis not present

## 2023-05-17 DIAGNOSIS — E668 Other obesity: Secondary | ICD-10-CM | POA: Diagnosis not present

## 2023-05-17 DIAGNOSIS — Z6831 Body mass index (BMI) 31.0-31.9, adult: Secondary | ICD-10-CM | POA: Diagnosis not present

## 2023-05-17 DIAGNOSIS — R634 Abnormal weight loss: Secondary | ICD-10-CM | POA: Diagnosis not present

## 2023-05-17 DIAGNOSIS — Z760 Encounter for issue of repeat prescription: Secondary | ICD-10-CM | POA: Diagnosis not present

## 2023-05-27 DIAGNOSIS — K828 Other specified diseases of gallbladder: Secondary | ICD-10-CM | POA: Diagnosis not present

## 2023-06-01 DIAGNOSIS — K828 Other specified diseases of gallbladder: Secondary | ICD-10-CM | POA: Diagnosis not present

## 2023-06-04 ENCOUNTER — Other Ambulatory Visit: Payer: Self-pay | Admitting: General Surgery

## 2023-06-04 DIAGNOSIS — K811 Chronic cholecystitis: Secondary | ICD-10-CM | POA: Diagnosis not present

## 2023-06-04 DIAGNOSIS — K828 Other specified diseases of gallbladder: Secondary | ICD-10-CM | POA: Diagnosis not present

## 2023-06-15 DIAGNOSIS — H527 Unspecified disorder of refraction: Secondary | ICD-10-CM | POA: Diagnosis not present

## 2023-06-30 DIAGNOSIS — Z6830 Body mass index (BMI) 30.0-30.9, adult: Secondary | ICD-10-CM | POA: Diagnosis not present

## 2023-06-30 DIAGNOSIS — E668 Other obesity: Secondary | ICD-10-CM | POA: Diagnosis not present

## 2023-06-30 DIAGNOSIS — R634 Abnormal weight loss: Secondary | ICD-10-CM | POA: Diagnosis not present

## 2023-07-06 DIAGNOSIS — M542 Cervicalgia: Secondary | ICD-10-CM | POA: Diagnosis not present

## 2023-07-06 DIAGNOSIS — M5441 Lumbago with sciatica, right side: Secondary | ICD-10-CM | POA: Diagnosis not present

## 2023-07-06 DIAGNOSIS — M25561 Pain in right knee: Secondary | ICD-10-CM | POA: Diagnosis not present

## 2023-07-06 DIAGNOSIS — M5442 Lumbago with sciatica, left side: Secondary | ICD-10-CM | POA: Diagnosis not present

## 2023-07-06 DIAGNOSIS — G8929 Other chronic pain: Secondary | ICD-10-CM | POA: Diagnosis not present

## 2023-07-06 DIAGNOSIS — M25562 Pain in left knee: Secondary | ICD-10-CM | POA: Diagnosis not present

## 2023-07-27 ENCOUNTER — Ambulatory Visit: Payer: Federal, State, Local not specified - PPO | Admitting: Allergy

## 2023-08-02 DIAGNOSIS — M797 Fibromyalgia: Secondary | ICD-10-CM | POA: Diagnosis not present

## 2023-08-10 DIAGNOSIS — E668 Other obesity: Secondary | ICD-10-CM | POA: Diagnosis not present

## 2023-08-10 DIAGNOSIS — Z760 Encounter for issue of repeat prescription: Secondary | ICD-10-CM | POA: Diagnosis not present

## 2023-08-10 DIAGNOSIS — R634 Abnormal weight loss: Secondary | ICD-10-CM | POA: Diagnosis not present

## 2023-08-10 DIAGNOSIS — Z6830 Body mass index (BMI) 30.0-30.9, adult: Secondary | ICD-10-CM | POA: Diagnosis not present

## 2023-08-12 DIAGNOSIS — N763 Subacute and chronic vulvitis: Secondary | ICD-10-CM | POA: Diagnosis not present

## 2023-08-12 DIAGNOSIS — N898 Other specified noninflammatory disorders of vagina: Secondary | ICD-10-CM | POA: Diagnosis not present

## 2023-08-24 DIAGNOSIS — T50905S Adverse effect of unspecified drugs, medicaments and biological substances, sequela: Secondary | ICD-10-CM | POA: Diagnosis not present

## 2023-08-24 DIAGNOSIS — M5030 Other cervical disc degeneration, unspecified cervical region: Secondary | ICD-10-CM | POA: Diagnosis not present

## 2023-08-24 DIAGNOSIS — M5136 Other intervertebral disc degeneration, lumbar region: Secondary | ICD-10-CM | POA: Diagnosis not present

## 2023-08-24 DIAGNOSIS — G894 Chronic pain syndrome: Secondary | ICD-10-CM | POA: Diagnosis not present

## 2023-09-22 DIAGNOSIS — Z760 Encounter for issue of repeat prescription: Secondary | ICD-10-CM | POA: Diagnosis not present

## 2023-09-22 DIAGNOSIS — Z6829 Body mass index (BMI) 29.0-29.9, adult: Secondary | ICD-10-CM | POA: Diagnosis not present

## 2023-09-22 DIAGNOSIS — M797 Fibromyalgia: Secondary | ICD-10-CM | POA: Diagnosis not present

## 2023-09-22 DIAGNOSIS — E663 Overweight: Secondary | ICD-10-CM | POA: Diagnosis not present

## 2023-09-27 DIAGNOSIS — E039 Hypothyroidism, unspecified: Secondary | ICD-10-CM | POA: Diagnosis not present

## 2023-09-27 DIAGNOSIS — E049 Nontoxic goiter, unspecified: Secondary | ICD-10-CM | POA: Diagnosis not present

## 2023-09-27 DIAGNOSIS — E559 Vitamin D deficiency, unspecified: Secondary | ICD-10-CM | POA: Diagnosis not present

## 2023-10-05 DIAGNOSIS — E049 Nontoxic goiter, unspecified: Secondary | ICD-10-CM | POA: Diagnosis not present

## 2023-10-05 DIAGNOSIS — Z23 Encounter for immunization: Secondary | ICD-10-CM | POA: Diagnosis not present

## 2023-10-05 DIAGNOSIS — E559 Vitamin D deficiency, unspecified: Secondary | ICD-10-CM | POA: Diagnosis not present

## 2023-10-05 DIAGNOSIS — R635 Abnormal weight gain: Secondary | ICD-10-CM | POA: Diagnosis not present

## 2023-11-08 DIAGNOSIS — Z7189 Other specified counseling: Secondary | ICD-10-CM | POA: Diagnosis not present

## 2023-11-08 DIAGNOSIS — E663 Overweight: Secondary | ICD-10-CM | POA: Diagnosis not present

## 2023-11-08 DIAGNOSIS — Z760 Encounter for issue of repeat prescription: Secondary | ICD-10-CM | POA: Diagnosis not present

## 2023-11-08 DIAGNOSIS — R638 Other symptoms and signs concerning food and fluid intake: Secondary | ICD-10-CM | POA: Diagnosis not present

## 2023-11-27 DIAGNOSIS — I878 Other specified disorders of veins: Secondary | ICD-10-CM | POA: Diagnosis not present

## 2023-11-27 DIAGNOSIS — M160 Bilateral primary osteoarthritis of hip: Secondary | ICD-10-CM | POA: Diagnosis not present

## 2023-12-20 DIAGNOSIS — E663 Overweight: Secondary | ICD-10-CM | POA: Diagnosis not present

## 2023-12-20 DIAGNOSIS — Z6831 Body mass index (BMI) 31.0-31.9, adult: Secondary | ICD-10-CM | POA: Diagnosis not present

## 2023-12-20 DIAGNOSIS — F439 Reaction to severe stress, unspecified: Secondary | ICD-10-CM | POA: Diagnosis not present

## 2024-01-07 DIAGNOSIS — Z0000 Encounter for general adult medical examination without abnormal findings: Secondary | ICD-10-CM | POA: Diagnosis not present

## 2024-01-07 DIAGNOSIS — Z1231 Encounter for screening mammogram for malignant neoplasm of breast: Secondary | ICD-10-CM | POA: Diagnosis not present

## 2024-01-17 DIAGNOSIS — Z0120 Encounter for dental examination and cleaning without abnormal findings: Secondary | ICD-10-CM | POA: Diagnosis not present

## 2024-01-24 DIAGNOSIS — Z1231 Encounter for screening mammogram for malignant neoplasm of breast: Secondary | ICD-10-CM | POA: Diagnosis not present

## 2024-01-24 DIAGNOSIS — Z6831 Body mass index (BMI) 31.0-31.9, adult: Secondary | ICD-10-CM | POA: Diagnosis not present

## 2024-02-21 DIAGNOSIS — Z01419 Encounter for gynecological examination (general) (routine) without abnormal findings: Secondary | ICD-10-CM | POA: Diagnosis not present

## 2024-02-21 DIAGNOSIS — Z8619 Personal history of other infectious and parasitic diseases: Secondary | ICD-10-CM | POA: Diagnosis not present

## 2024-02-21 DIAGNOSIS — Z90710 Acquired absence of both cervix and uterus: Secondary | ICD-10-CM | POA: Diagnosis not present

## 2024-02-21 DIAGNOSIS — N393 Stress incontinence (female) (male): Secondary | ICD-10-CM | POA: Diagnosis not present

## 2024-02-23 ENCOUNTER — Other Ambulatory Visit: Payer: Self-pay | Admitting: Endocrinology

## 2024-02-23 DIAGNOSIS — E039 Hypothyroidism, unspecified: Secondary | ICD-10-CM | POA: Diagnosis not present

## 2024-02-23 DIAGNOSIS — E049 Nontoxic goiter, unspecified: Secondary | ICD-10-CM

## 2024-02-23 DIAGNOSIS — E559 Vitamin D deficiency, unspecified: Secondary | ICD-10-CM | POA: Diagnosis not present

## 2024-02-28 ENCOUNTER — Ambulatory Visit
Admission: RE | Admit: 2024-02-28 | Discharge: 2024-02-28 | Disposition: A | Discharge status: Home / Self Care | Source: Ambulatory Visit | Attending: Endocrinology | Admitting: Endocrinology

## 2024-02-28 DIAGNOSIS — E049 Nontoxic goiter, unspecified: Secondary | ICD-10-CM

## 2024-03-06 DIAGNOSIS — Z6830 Body mass index (BMI) 30.0-30.9, adult: Secondary | ICD-10-CM | POA: Diagnosis not present

## 2024-03-06 DIAGNOSIS — F439 Reaction to severe stress, unspecified: Secondary | ICD-10-CM | POA: Diagnosis not present

## 2024-03-08 DIAGNOSIS — Z0120 Encounter for dental examination and cleaning without abnormal findings: Secondary | ICD-10-CM | POA: Diagnosis not present

## 2024-04-17 DIAGNOSIS — E039 Hypothyroidism, unspecified: Secondary | ICD-10-CM | POA: Diagnosis not present

## 2024-04-17 DIAGNOSIS — Z6830 Body mass index (BMI) 30.0-30.9, adult: Secondary | ICD-10-CM | POA: Diagnosis not present

## 2024-10-10 ENCOUNTER — Other Ambulatory Visit: Payer: Self-pay | Admitting: Family

## 2024-10-10 DIAGNOSIS — D509 Iron deficiency anemia, unspecified: Secondary | ICD-10-CM

## 2024-10-11 ENCOUNTER — Inpatient Hospital Stay: Attending: Hematology & Oncology

## 2024-10-11 ENCOUNTER — Inpatient Hospital Stay (HOSPITAL_BASED_OUTPATIENT_CLINIC_OR_DEPARTMENT_OTHER): Admitting: Family

## 2024-10-11 ENCOUNTER — Encounter: Payer: Self-pay | Admitting: Family

## 2024-10-11 VITALS — BP 117/81 | HR 72 | Temp 98.4°F | Resp 18 | Ht 62.99 in | Wt 172.0 lb

## 2024-10-11 DIAGNOSIS — D509 Iron deficiency anemia, unspecified: Secondary | ICD-10-CM

## 2024-10-11 LAB — CBC WITH DIFFERENTIAL (CANCER CENTER ONLY)
Abs Immature Granulocytes: 0.01 K/uL (ref 0.00–0.07)
Basophils Absolute: 0 K/uL (ref 0.0–0.1)
Basophils Relative: 1 %
Eosinophils Absolute: 0.1 K/uL (ref 0.0–0.5)
Eosinophils Relative: 3 %
HCT: 41.5 % (ref 36.0–46.0)
Hemoglobin: 14.2 g/dL (ref 12.0–15.0)
Immature Granulocytes: 0 %
Lymphocytes Relative: 33 %
Lymphs Abs: 1.4 K/uL (ref 0.7–4.0)
MCH: 30.3 pg (ref 26.0–34.0)
MCHC: 34.2 g/dL (ref 30.0–36.0)
MCV: 88.7 fL (ref 80.0–100.0)
Monocytes Absolute: 0.5 K/uL (ref 0.1–1.0)
Monocytes Relative: 11 %
Neutro Abs: 2.1 K/uL (ref 1.7–7.7)
Neutrophils Relative %: 52 %
Platelet Count: 226 K/uL (ref 150–400)
RBC: 4.68 MIL/uL (ref 3.87–5.11)
RDW: 12.2 % (ref 11.5–15.5)
WBC Count: 4.2 K/uL (ref 4.0–10.5)
nRBC: 0 % (ref 0.0–0.2)

## 2024-10-11 LAB — RETICULOCYTES
Immature Retic Fract: 12.8 % (ref 2.3–15.9)
RBC.: 4.69 MIL/uL (ref 3.87–5.11)
Retic Count, Absolute: 76 K/uL (ref 19.0–186.0)
Retic Ct Pct: 1.6 % (ref 0.4–3.1)

## 2024-10-11 LAB — FERRITIN: Ferritin: 225 ng/mL (ref 11–307)

## 2024-10-11 LAB — IRON AND IRON BINDING CAPACITY (CC-WL,HP ONLY)
Iron: 85 ug/dL (ref 28–170)
Saturation Ratios: 26 % (ref 10.4–31.8)
TIBC: 322 ug/dL (ref 250–450)
UIBC: 237 ug/dL

## 2024-10-11 NOTE — Progress Notes (Signed)
 Hematology and Oncology Follow Up Visit  Nicole Booth [id number] 10/05/1976 48 y.o. 10/11/2024   Principle Diagnosis:  Iron deficiency anemia    Current Therapy:        Status post hysterectomy IV iron as indicated  Folic acid  1 mg po q day   Interim History:  Nicole Booth is here today for follow-up. She is doing well but has lot of joint aches and pains associated with OA. She is hoping to get a second opinion referral from PCP for Rheumatology to assess for RA as well.  Ferritin has been mildly elevated in the past which is reactive. Hemochromatosis DNA was negative.  She notes mild SOB with exertion and some fatigue at times.  No fever, chills, n/v, cough, rash, dizziness, chest pain, palpitations, abdominal pain or changes in bowel or bladder habits.  No swelling noted in her extremities at this time.  She has tingling in her right hand that comes and goes.  No falls or syncope reported.  Appetite and hydration are good Weight is stable at 172 lbs.   ECOG Performance Status: 1 - Symptomatic but completely ambulatory  Medications:  Allergies as of 10/11/2024       Reactions   Penicillins Hives, Rash, Other (See Comments)   Has patient had a PCN reaction causing immediate rash, facial/tongue/throat swelling, SOB or lightheadedness with hypotension: Yes Has patient had a PCN reaction causing severe rash involving mucus membranes or skin necrosis: No Has patient had a PCN reaction that required hospitalization No Has patient had a PCN reaction occurring within the last 10 years: No If all of the above answers are NO, then may proceed with Cephalosporin use.   Shellfish Allergy Itching   Shellfish Protein-containing Drug Products Hives   Itching   Tilactase Diarrhea   (Dairy Products) IBS w/Diarrhea- upsets stomach   Latex Itching   Patient states that she does not need a test. Not really an allergy. Has had some itching with gloves but has had surgery before  without issues        Medication List        Accurate as of October 11, 2024 10:18 AM. If you have any questions, ask your nurse or doctor.          acetaminophen  500 MG tablet Commonly known as: TYLENOL  Take by mouth 3 (three) times daily as needed.   albuterol  108 (90 Base) MCG/ACT inhaler Commonly known as: VENTOLIN  HFA Inhale 2 puffs into the lungs every 4 (four) hours as needed for wheezing or shortness of breath.   budesonide -formoterol  160-4.5 MCG/ACT inhaler Commonly known as: Symbicort  Inhale 2 puffs into the lungs 2 (two) times daily.   busPIRone 10 MG tablet Commonly known as: BUSPAR Take 10 mg by mouth 3 (three) times daily.   Carbinoxamine  Maleate 4 MG Tabs Take 1 tablet (4 mg total) by mouth every 6 (six) hours as needed.   Dapsone 5 % topical gel   DULoxetine 30 MG capsule Commonly known as: CYMBALTA   DULOXETINE HCL PO TAKE THREE CAPSULES BY MOUTH AT BEDTIME FOR MOOD AND PAIN   EPINEPHrine  0.3 mg/0.3 mL Soaj injection Commonly known as: Auvi-Q  Inject 0.3 mLs (0.3 mg total) into the muscle as needed for anaphylaxis.   ergocalciferol  1.25 MG (50000 UT) capsule Commonly known as: VITAMIN D2 Take 1 capsule (50,000 Units total) by mouth 3 (three) times a week.   Flovent  HFA 110 MCG/ACT inhaler Generic drug: fluticasone  Inhale 2 puffs into the lungs  2 (two) times daily.   Fluocinolone  Acetonide 0.01 % Oil Place 5 drops in ear(s) 2 (two) times daily as needed (2 times per day as needed x 14 days).   Fluticasone  Propionate 93 MCG/ACT Exhu Commonly known as: Xhance  Place 2 sprays into the nose 2 (two) times daily.   folic acid  1 MG tablet Commonly known as: FOLVITE  Take 1 tablet (1 mg total) by mouth daily.   hyoscyamine  0.375 MG 12 hr tablet Commonly known as: LEVBID  Take 1 tablet (0.375 mg total) by mouth at bedtime.   hyoscyamine  0.125 MG Tbdp disintergrating tablet Commonly known as: ANASPAZ  Place 1 tablet (0.125 mg total) under the  tongue every 4 (four) hours as needed for bladder spasms.   ibuprofen  800 MG tablet Commonly known as: ADVIL  Take 1 tablet (800 mg total) by mouth every 8 (eight) hours as needed (mild pain).   ipratropium 0.03 % nasal spray Commonly known as: ATROVENT   modafinil 100 MG tablet Commonly known as: PROVIGIL Take 100 mg by mouth daily.   montelukast  10 MG tablet Commonly known as: SINGULAIR  Take 1 tablet (10 mg total) by mouth at bedtime.   SUMAtriptan 100 MG tablet Commonly known as: IMITREX Take 100 mg by mouth as needed. May repeat in 2 hours if headache persists or recurs.   topiramate 100 MG tablet Commonly known as: TOPAMAX Take by mouth.   topiramate 25 MG tablet Commonly known as: TOPAMAX Take 25 mg by mouth daily.   traZODone 50 MG tablet Commonly known as: DESYREL Take 25 mg by mouth at bedtime.   valACYclovir 500 MG tablet Commonly known as: VALTREX Take 500 mg by mouth 2 (two) times daily as needed.   Wegovy 2.4 MG/0.75ML Soaj SQ injection Generic drug: semaglutide-weight management Inject 2.4 mg into the skin once a week.        Allergies:  Allergies  Allergen Reactions   Penicillins Hives, Rash and Other (See Comments)    Has patient had a PCN reaction causing immediate rash, facial/tongue/throat swelling, SOB or lightheadedness with hypotension: Yes Has patient had a PCN reaction causing severe rash involving mucus membranes or skin necrosis: No Has patient had a PCN reaction that required hospitalization No Has patient had a PCN reaction occurring within the last 10 years: No If all of the above answers are NO, then may proceed with Cephalosporin use.    Shellfish Allergy Itching   Shellfish Protein-Containing Drug Products Hives    Itching   Tilactase Diarrhea    (Dairy Products) IBS w/Diarrhea- upsets stomach    Latex Itching    Patient states that she does not need a test. Not really an allergy. Has had some itching with gloves but has  had surgery before without issues     Past Medical History, Surgical history, Social history, and Family History were reviewed and updated.  Review of Systems: All other 10 point review of systems is negative.   Physical Exam:  height is 5' 2.99 (1.6 m) and weight is 172 lb (78 kg). Her oral temperature is 98.4 F (36.9 C). Her blood pressure is 117/81 and her pulse is 72. Her respiration is 18 and oxygen saturation is 98%.   Wt Readings from Last 3 Encounters:  10/11/24 172 lb (78 kg)  12/07/19 198 lb (89.8 kg)  07/06/19 191 lb (86.6 kg)    Ocular: Sclerae unicteric, pupils equal, round and reactive to light Ear-nose-throat: Oropharynx clear, dentition fair Lymphatic: No cervical or supraclavicular adenopathy Lungs  no rales or rhonchi, good excursion bilaterally Heart regular rate and rhythm, no murmur appreciated Abd soft, nontender, positive bowel sounds MSK no focal spinal tenderness, no joint edema Neuro: non-focal, well-oriented, appropriate affect Breasts: Deferred   Lab Results  Component Value Date   WBC 4.2 10/11/2024   HGB 14.2 10/11/2024   HCT 41.5 10/11/2024   MCV 88.7 10/11/2024   PLT 226 10/11/2024   Lab Results  Component Value Date   FERRITIN 185 01/04/2023   IRON 69 01/04/2023   TIBC 293 01/04/2023   UIBC 224 01/04/2023   IRONPCTSAT 24 01/04/2023   Lab Results  Component Value Date   RETICCTPCT 1.6 10/11/2024   RBC 4.69 10/11/2024   RETICCTABS 62.9 09/16/2015   No results found for: KPAFRELGTCHN, LAMBDASER, KAPLAMBRATIO No results found for: IGGSERUM, IGA, IGMSERUM No results found for: STEPHANY CARLOTA BENSON MARKEL EARLA JOANNIE DOC VICK, SPEI   Chemistry      Component Value Date/Time   NA 142 01/04/2023 1438   NA 142 01/23/2019 1404   NA 137 12/15/2016 1050   K 3.9 01/04/2023 1438   K 3.6 01/22/2017 1341   K 4.2 12/15/2016 1050   CL 104 01/04/2023 1438   CL 104 01/22/2017 1341   CO2 29  01/04/2023 1438   CO2 23 01/22/2017 1341   CO2 26 12/15/2016 1050   BUN 12 01/04/2023 1438   BUN 10 01/23/2019 1404   BUN 11.1 12/15/2016 1050   CREATININE 1.14 (H) 01/04/2023 1438   CREATININE 1.07 (H) 01/22/2017 1341   CREATININE 0.9 12/15/2016 1050      Component Value Date/Time   CALCIUM 9.2 01/04/2023 1438   CALCIUM 9.9 01/22/2017 1341   CALCIUM 9.1 12/15/2016 1050   ALKPHOS 51 01/04/2023 1438   ALKPHOS 60 01/22/2017 1341   ALKPHOS 66 12/15/2016 1050   AST 11 (L) 01/04/2023 1438   AST 15 12/15/2016 1050   ALT <5 01/04/2023 1438   ALT <6 12/15/2016 1050   BILITOT 0.4 01/04/2023 1438   BILITOT 0.45 12/15/2016 1050       Impression and Plan:  Ms. Beverley 48 yo African American female with history of iron deficiency anemia.  Iron studies are pending. We will replace if needed.  Follow-up in 3 months.   Lauraine Pepper, NP 11/5/202510:18 AM

## 2024-11-16 ENCOUNTER — Other Ambulatory Visit: Payer: Self-pay | Admitting: Physician Assistant

## 2024-11-16 DIAGNOSIS — Z1231 Encounter for screening mammogram for malignant neoplasm of breast: Secondary | ICD-10-CM

## 2025-01-24 ENCOUNTER — Ambulatory Visit

## 2025-10-10 ENCOUNTER — Inpatient Hospital Stay: Admitting: Family

## 2025-10-10 ENCOUNTER — Inpatient Hospital Stay
